# Patient Record
Sex: Female | Born: 2000
Health system: Southern US, Community
[De-identification: ages and names within clinical notes are randomized; demographics above are authoritative.]

## PROBLEM LIST (undated history)

## (undated) DIAGNOSIS — E119 Type 2 diabetes mellitus without complications: Secondary | ICD-10-CM

## (undated) DIAGNOSIS — J45909 Unspecified asthma, uncomplicated: Secondary | ICD-10-CM

## (undated) DIAGNOSIS — T7840XA Allergy, unspecified, initial encounter: Secondary | ICD-10-CM

## (undated) HISTORY — DX: Allergy, unspecified, initial encounter: T78.40XA

## (undated) HISTORY — PX: ADENOIDECTOMY: SUR15

## (undated) HISTORY — DX: Type 2 diabetes mellitus without complications: E11.9

---
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
## 2022-04-25 ENCOUNTER — Encounter (HOSPITAL_COMMUNITY): Payer: Self-pay

## 2022-04-25 ENCOUNTER — Ambulatory Visit (HOSPITAL_COMMUNITY)
Admission: EM | Admit: 2022-04-25 | Discharge: 2022-04-25 | Disposition: A | Discharge status: Home / Self Care | Payer: Medicaid Other | Attending: Emergency Medicine | Admitting: Emergency Medicine

## 2022-04-25 DIAGNOSIS — N939 Abnormal uterine and vaginal bleeding, unspecified: Secondary | ICD-10-CM | POA: Diagnosis present

## 2022-04-25 LAB — CBC
HCT: 39.3 % (ref 36.0–46.0)
Hemoglobin: 13.7 g/dL (ref 12.0–15.0)
MCH: 25.8 pg — ABNORMAL LOW (ref 26.0–34.0)
MCHC: 34.9 g/dL (ref 30.0–36.0)
MCV: 74.2 fL — ABNORMAL LOW (ref 80.0–100.0)
Platelets: 295 10*3/uL (ref 150–400)
RBC: 5.3 MIL/uL — ABNORMAL HIGH (ref 3.87–5.11)
RDW: 14.2 % (ref 11.5–15.5)
WBC: 7.5 10*3/uL (ref 4.0–10.5)
nRBC: 0 % (ref 0.0–0.2)

## 2022-04-25 LAB — HCG, QUANTITATIVE, PREGNANCY: hCG, Beta Chain, Quant, S: <1 m[IU]/mL (ref <5)

## 2022-04-25 LAB — POC URINE PREG, ED: Preg Test, Ur: NEGATIVE

## 2022-04-25 NOTE — ED Provider Notes (Signed)
[facility]    CSN: [id number] Arrival date & time: 04/25/22  1123     History   Chief Complaint Vaginal bleeding   HPI Debra [name] is a 21 y.o. female.  Presents with 2-day history of vaginal bleeding, multiple small clots.  LMP March 11.  Changes regular tampon 3-4 times a day.  She has abdominal cramping and nausea.  Reports usually periods are very regular.  Does not use any forms of birth control.  Took a pregnancy test in May that was negative.  Has not taken one since then.  She is sexually active and had unprotected intercourse recently.  Denies heavy bleeding or large clots, weakness, lightheadedness, dizziness, shortness of breath.  Past Medical History:  Diagnosis Date   Asthma    Diabetes mellitus without complication [facility])     Patient Active Problem List   Diagnosis Date Noted   Asthma 09/28/2017   Adjustment disorder of adolescence 05/07/2016   Abnormal weight gain 05/07/2016   Morbid obesity due to excess calories ([facility]) 12/02/2015   Type 2 diabetes mellitus without complication ([facility]) 05/29/2015   Acanthosis nigricans 05/29/2015   Morbid obesity ([facility]) 05/29/2015    Past Surgical History:  Procedure Laterality Date   ADENOIDECTOMY     at age 36 years    OB History   No obstetric history on file.      Home Medications    Prior to Admission medications   Not on File    Family History Family History  Problem Relation Age of Onset   Hypertension Mother    Hypertension Father    Asthma Brother    Cancer Maternal Grandfather    Hypertension Maternal Grandfather    Alcohol abuse Maternal Grandfather    Arthritis Maternal Grandfather    Drug abuse Maternal Grandfather    Mental illness Maternal Grandmother    Drug abuse Maternal Grandmother    Depression Maternal Grandmother    Alcohol abuse Paternal Grandmother    Arthritis Paternal Grandmother    Drug abuse Paternal Grandmother    Hearing loss Paternal Grandmother     Hypertension Paternal Grandmother     Social History Social History   Tobacco Use   Smoking status: Never   Smokeless tobacco: Never  Vaping Use   Vaping Use: Never used  Substance Use Topics   Alcohol use: Yes    Comment: occ   Drug use: Yes    Frequency: 7.0 times per week    Types: Marijuana     Allergies   Patient has no known allergies.   Review of Systems Review of Systems  Per HPI  Physical Exam Triage Vital Signs ED Triage Vitals  Enc Vitals Group     BP 04/25/22 1322 (!) 147/96     Pulse Rate 04/25/22 1322 98     Resp 04/25/22 1322 18     Temp 04/25/22 1322 98 F (36.7 C)     Temp Source 04/25/22 1322 Oral     SpO2 04/25/22 1322 98 %     Weight --      Height --      Head Circumference --      Peak Flow --      Pain Score 04/25/22 1318 4     Pain Loc --      Pain Edu? --      Excl. in GC? --    No data found.  Updated Vital Signs BP (!) 147/96 (BP Location: Right  Arm)   Pulse 98   Temp 98 F (36.7 C) (Oral)   Resp 18   LMP 01/22/2022   SpO2 98%    Physical Exam Vitals and nursing note reviewed.  Constitutional:      General: She is not in acute distress.    Appearance: Normal appearance. She is not ill-appearing.     Comments: Resting comfortably, no acute distress  HENT:     Mouth/Throat:     Mouth: Mucous membranes are moist.     Pharynx: Oropharynx is clear.  Eyes:     Conjunctiva/sclera: Conjunctivae normal.     Pupils: Pupils are equal, round, and reactive to light.  Cardiovascular:     Rate and Rhythm: Normal rate and regular rhythm.     Pulses: Normal pulses.     Heart sounds: Normal heart sounds.  Pulmonary:     Effort: Pulmonary effort is normal.     Breath sounds: Normal breath sounds.  Abdominal:     General: Bowel sounds are normal.     Tenderness: There is abdominal tenderness in the suprapubic area. There is no guarding or rebound.  Musculoskeletal:        General: Normal range of motion.     Cervical back:  Normal range of motion.  Lymphadenopathy:     Cervical: No cervical adenopathy.  Skin:    General: Skin is warm and dry.  Neurological:     General: No focal deficit present.     Mental Status: She is alert and oriented to person, place, and time.    UC Treatments / Results  Labs (all labs ordered are listed, but only abnormal results are displayed) Labs Reviewed  CBC - Abnormal; Notable for the following components:      Result Value   RBC 5.30 (*)    MCV 74.2 (*)    MCH 25.8 (*)    All other components within normal limits  HCG, QUANTITATIVE, PREGNANCY  POC URINE PREG, ED   EKG  Radiology No results found.  Procedures Procedures   Medications Ordered in UC Medications - No data to display  Initial Impression / Assessment and Plan / UC Course  I have reviewed the triage vital signs and the nursing notes.  Pertinent labs & imaging results that were available during my care of the patient were reviewed by me and considered in my medical decision making (see chart for details).  Attempted to get urine sample from patient, she used collection hat in the toilet and sample ended up being all vaginal blood.  She was unable to provide a clean-catch urine.  Ordered stat beta hCG and CBC.  beta hCG <1, hgb normal at 13.7.  We discussed etiology of her bleeding may be her menstrual cycle.  I recommend that she follow-up with an OB/GYN. Information for women's clinic provided. Return precautions discussed. Patient agrees to plan and is discharged in stable condition.  Final Clinical Impressions(s) / UC Diagnoses   Final diagnoses:  Vaginal bleeding     Discharge Instructions      I have attached information for one of the women's clinics in the area.  I recommend you establish with an OB/GYN and follow-up with them regarding your symptoms.    ED Prescriptions   None    PDMP not reviewed this encounter.   Keondra Haydu, Lurena Joiner, [place] 04/25/22 1603

## 2022-04-25 NOTE — Discharge Instructions (Addendum)
I have attached information for one of the women's clinics in the area.  I recommend you establish with an OB/GYN and follow-up with them regarding your symptoms.

## 2022-04-25 NOTE — ED Triage Notes (Signed)
Pt presents with vaginal bleeding and multiple pea sized clots.  States she hadn't had period since March 11th.  Took multiple negative pregnancy tests. Heavy bleeding started two days ago.  Reports cramping and slight queasiness this morning.

## 2022-11-09 ENCOUNTER — Encounter (HOSPITAL_COMMUNITY): Payer: Self-pay

## 2022-11-09 ENCOUNTER — Ambulatory Visit (HOSPITAL_COMMUNITY)
Admission: EM | Admit: 2022-11-09 | Discharge: 2022-11-09 | Disposition: A | Discharge status: Home / Self Care | Payer: Medicaid Other | Attending: Emergency Medicine | Admitting: Emergency Medicine

## 2022-11-09 DIAGNOSIS — J4521 Mild intermittent asthma with (acute) exacerbation: Secondary | ICD-10-CM | POA: Diagnosis not present

## 2022-11-09 MED ORDER — PREDNISONE 20 MG PO TABS: 40.0000 mg | ORAL_TABLET | Freq: Every day | ORAL | 0 refills | Status: DC

## 2022-11-09 MED ORDER — ALBUTEROL SULFATE HFA 108 (90 BASE) MCG/ACT IN AERS: 2.0000 | INHALATION_SPRAY | RESPIRATORY_TRACT | 0 refills | Status: DC | PRN

## 2022-11-09 MED ORDER — IPRATROPIUM-ALBUTEROL 0.5-2.5 (3) MG/3ML IN SOLN
RESPIRATORY_TRACT | Status: AC
  Filled 2022-11-09: qty 3

## 2022-11-09 MED ORDER — IPRATROPIUM-ALBUTEROL 0.5-2.5 (3) MG/3ML IN SOLN
3.0000 mL | Freq: Once | RESPIRATORY_TRACT | Status: AC
  Administered 2022-11-09: 3 mL via RESPIRATORY_TRACT

## 2022-11-09 NOTE — Discharge Instructions (Signed)
Today you are being treated for inflammation to your upper airways  In office you have been given a breathing treatment  Begin use of prednisone every morning with food for 5 days to help reduce inflammation   May use albuterol inhaler taking 2 puffs every 4 hours as needed to help calm shortness of breath and wheezing  In addition:  Maintaining adequate hydration may help to thin secretions and soothe the respiratory mucosa   Warm Liquids- Ingestion of warm liquids may have a soothing effect on the respiratory mucosa, increase the flow of nasal mucus, and loosen respiratory secretions, making them easier to remove  May try honey (2.5 to 5 mL [0.5 to 1 teaspoon]) can be given straight or diluted in liquid (juice). Corn syrup may be substituted if honey is not available.

## 2022-11-09 NOTE — ED Provider Notes (Signed)
[facility]    CSN: [id number] Arrival date & time: 11/09/22  1446      History   Chief Complaint Chief Complaint  Patient presents with   Shortness of Breath    HPI Debra [name] is a 20 y.o. female.   Patient presents for evaluation of cough, shortness of breath with rest worsened by exertion and intermittent wheezing for 3 days.  No appearance of wheezing today.  Associated chest soreness worsened by coughing.  Endorses nasal congestion at baseline with no change.  No known sick contacts.  Tolerating food and liquids.  History of asthma but lost albuterol inhaler.   Past Medical History:  Diagnosis Date   Asthma    Diabetes mellitus without complication [facility])     Patient Active Problem List   Diagnosis Date Noted   Asthma 09/28/2017   Adjustment disorder of adolescence 05/07/2016   Abnormal weight gain 05/07/2016   Morbid obesity due to excess calories (HCC) 12/02/2015   Type 2 diabetes mellitus without complication (HCC) 05/29/2015   Acanthosis nigricans 05/29/2015   Morbid obesity (HCC) 05/29/2015    Past Surgical History:  Procedure Laterality Date   ADENOIDECTOMY     at age 14 years    OB History   No obstetric history on file.      Home Medications    Prior to Admission medications   Not on File    Family History Family History  Problem Relation Age of Onset   Hypertension Mother    Hypertension Father    Asthma Brother    Cancer Maternal Grandfather    Hypertension Maternal Grandfather    Alcohol abuse Maternal Grandfather    Arthritis Maternal Grandfather    Drug abuse Maternal Grandfather    Mental illness Maternal Grandmother    Drug abuse Maternal Grandmother    Depression Maternal Grandmother    Alcohol abuse Paternal Grandmother    Arthritis Paternal Grandmother    Drug abuse Paternal Grandmother    Hearing loss Paternal Grandmother    Hypertension Paternal Grandmother     Social History Social History    Tobacco Use   Smoking status: Never   Smokeless tobacco: Never  Vaping Use   Vaping Use: Never used  Substance Use Topics   Alcohol use: Yes    Comment: occ   Drug use: Yes    Frequency: 7.0 times per week    Types: Marijuana     Allergies   Patient has no known allergies.   Review of Systems Review of Systems  Constitutional: Negative.   HENT: Negative.    Respiratory:  Positive for cough, shortness of breath and wheezing. Negative for apnea, choking, chest tightness and stridor.   Cardiovascular: Negative.   Gastrointestinal: Negative.   Skin: Negative.   Neurological: Negative.      Physical Exam Triage Vital Signs ED Triage Vitals [11/09/22 1735]  Enc Vitals Group     BP (!) 131/98     Pulse Rate 96     Resp 12     Temp 98.7 F (37.1 C)     Temp Source Oral     SpO2 97 %     Weight      Height      Head Circumference      Peak Flow      Pain Score      Pain Loc      Pain Edu?      Excl. in GC?  No data found.  Updated Vital Signs BP (!) 131/98 (BP Location: Left Arm)   Pulse 96   Temp 98.7 F (37.1 C) (Oral)   Resp 12   SpO2 97%   Visual Acuity Right Eye Distance:   Left Eye Distance:   Bilateral Distance:    Right Eye Near:   Left Eye Near:    Bilateral Near:     Physical Exam Constitutional:      Appearance: Normal appearance.  Eyes:     Extraocular Movements: Extraocular movements intact.  Cardiovascular:     Rate and Rhythm: Normal rate and regular rhythm.     Pulses: Normal pulses.     Heart sounds: Normal heart sounds.  Pulmonary:     Effort: Pulmonary effort is normal.     Breath sounds: Normal breath sounds.  Skin:    General: Skin is warm and dry.  Neurological:     Mental Status: She is alert and oriented to person, place, and time. Mental status is at baseline.      UC Treatments / Results  Labs (all labs ordered are listed, but only abnormal results are displayed) Labs Reviewed - No data to  display  EKG   Radiology No results found.  Procedures Procedures (including critical care time)  Medications Ordered in UC Medications - No data to display  Initial Impression / Assessment and Plan / UC Course  I have reviewed the triage vital signs and the nursing notes.  Pertinent labs & imaging results that were available during my care of the patient were reviewed by me and considered in my medical decision making (see chart for details).  Mild intermittent asthma with exacerbation  Vitals are stable, O2 saturation 97% on room air, lungs are clear to auscultation, discussed with patient, DuoNeb given per patient request, prescribed prednisone and refilled albuterol inhaler for outpatient use, declined cough medications at this time, recommended additional supportive care with urgent care follow-up as needed Final Clinical Impressions(s) / UC Diagnoses   Final diagnoses:  None   Discharge Instructions   None    ED Prescriptions   None    PDMP not reviewed this encounter.   Valinda Hoar, NP 11/09/22 1754

## 2022-11-09 NOTE — ED Triage Notes (Signed)
Pt is here for asthma flare x3days

## 2022-11-23 ENCOUNTER — Encounter: Payer: Self-pay | Admitting: Obstetrics & Gynecology

## 2022-11-23 ENCOUNTER — Encounter: Payer: Medicaid Other | Admitting: Radiology

## 2022-11-29 ENCOUNTER — Encounter: Payer: Medicaid Other | Admitting: Radiology

## 2022-12-02 ENCOUNTER — Encounter: Payer: Medicaid Other | Admitting: Obstetrics & Gynecology

## 2022-12-21 ENCOUNTER — Encounter: Payer: Self-pay | Admitting: Family Medicine

## 2022-12-21 ENCOUNTER — Ambulatory Visit (INDEPENDENT_AMBULATORY_CARE_PROVIDER_SITE_OTHER): Payer: Medicaid Other | Admitting: Family Medicine

## 2022-12-21 VITALS — BP 132/88 | HR 100 | Temp 97.6°F | Ht 66.0 in | Wt 319.0 lb

## 2022-12-21 DIAGNOSIS — Z9189 Other specified personal risk factors, not elsewhere classified: Secondary | ICD-10-CM | POA: Diagnosis not present

## 2022-12-21 DIAGNOSIS — E119 Type 2 diabetes mellitus without complications: Secondary | ICD-10-CM

## 2022-12-21 DIAGNOSIS — J4520 Mild intermittent asthma, uncomplicated: Secondary | ICD-10-CM

## 2022-12-21 LAB — CBC WITH DIFFERENTIAL/PLATELET
Basophils Absolute: 0 10*3/uL (ref 0.0–0.1)
Basophils Relative: 0.2 % (ref 0.0–3.0)
Eosinophils Absolute: 0.1 10*3/uL (ref 0.0–0.7)
Eosinophils Relative: 1 % (ref 0.0–5.0)
HCT: 36 % (ref 36.0–46.0)
Hemoglobin: 12.1 g/dL (ref 12.0–15.0)
Lymphocytes Relative: 26.1 % (ref 12.0–46.0)
Lymphs Abs: 2.5 10*3/uL (ref 0.7–4.0)
MCHC: 33.5 g/dL (ref 30.0–36.0)
MCV: 75.2 fl — ABNORMAL LOW (ref 78.0–100.0)
Monocytes Absolute: 0.5 10*3/uL (ref 0.1–1.0)
Monocytes Relative: 5.1 % (ref 3.0–12.0)
Neutro Abs: 6.4 10*3/uL (ref 1.4–7.7)
Neutrophils Relative %: 67.6 % (ref 43.0–77.0)
Platelets: 377 10*3/uL (ref 150.0–400.0)
RBC: 4.79 Mil/uL (ref 3.87–5.11)
RDW: 15.5 % (ref 11.5–15.5)
WBC: 9.4 10*3/uL (ref 4.0–10.5)

## 2022-12-21 LAB — HEMOGLOBIN A1C: Hgb A1c MFr Bld: 5.5 % (ref 4.6–6.5)

## 2022-12-21 LAB — COMPREHENSIVE METABOLIC PANEL
ALT: 15 U/L (ref 0–35)
AST: 14 U/L (ref 0–37)
Albumin: 4.2 g/dL (ref 3.5–5.2)
Alkaline Phosphatase: 59 U/L (ref 39–117)
BUN: 8 mg/dL (ref 6–23)
CO2: 27 mEq/L (ref 19–32)
Calcium: 9.1 mg/dL (ref 8.4–10.5)
Chloride: 106 mEq/L (ref 96–112)
Creatinine, Ser: 0.92 mg/dL (ref 0.40–1.20)
GFR: 88.56 mL/min (ref >60.00)
Glucose, Bld: 79 mg/dL (ref 70–99)
Potassium: 4.1 mEq/L (ref 3.5–5.1)
Sodium: 140 mEq/L (ref 135–145)
Total Bilirubin: 0.4 mg/dL (ref 0.2–1.2)
Total Protein: 7.2 g/dL (ref 6.0–8.3)

## 2022-12-21 LAB — TSH: TSH: 0.78 u[IU]/mL (ref 0.35–5.50)

## 2022-12-21 NOTE — Progress Notes (Signed)
New Patient Office Visit  Subjective    Patient ID: Debra [name], female    DOB: 2001-08-07  Age: 22 y.o. MRN: [id number]  CC:  Chief Complaint  Patient presents with   Establish Care    Would like to have blood work done as well as STD testing    HPI Debra [name] presents to establish care Moved here from [place], [place]   Having unprotected sex. No birth control. Requests STD testing.  No symptoms.   LMP: 12/08/2022  Hx of asthma. Uses albuterol prn  Diabetes- is not on medication.   Denies fever, chills, dizziness, chest pain, palpitations, shortness of breath, abdominal pain, N/V/D, urinary symptoms, LE edema.    Single. No kids.  Currently not employed.   Smokes marijuana. Alcohol on weekends.  No tobacco.    Outpatient Encounter Medications as of 12/21/2022  Medication Sig   albuterol (VENTOLIN HFA) 108 (90 Base) MCG/ACT inhaler Inhale 2 puffs into the lungs every 4 (four) hours as needed for wheezing or shortness of breath.   [DISCONTINUED] predniSONE (DELTASONE) 20 MG tablet Take 2 tablets (40 mg total) by mouth daily.   No facility-administered encounter medications on file as of 12/21/2022.    Past Medical History:  Diagnosis Date   Asthma    Diabetes mellitus without complication ([place])     Past Surgical History:  Procedure Laterality Date   ADENOIDECTOMY     at age 3 years    Family History  Problem Relation Age of Onset   Hypertension Mother    Hypertension Father    Asthma Brother    Cancer Maternal Grandfather    Hypertension Maternal Grandfather    Alcohol abuse Maternal Grandfather    Arthritis Maternal Grandfather    Drug abuse Maternal Grandfather    Mental illness Maternal Grandmother    Drug abuse Maternal Grandmother    Depression Maternal Grandmother    Alcohol abuse Paternal Grandmother    Arthritis Paternal Grandmother    Drug abuse Paternal Grandmother    Hearing loss Paternal Grandmother    Hypertension  Paternal Grandmother     Social History   Socioeconomic History   Marital status: Single    Spouse name: Not on file   Number of children: Not on file   Years of education: Not on file   Highest education level: Not on file  Occupational History   Occupation: Ship broker  Tobacco Use   Smoking status: Never   Smokeless tobacco: Never  Vaping Use   Vaping Use: Never used  Substance and Sexual Activity   Alcohol use: Yes    Comment: occ   Drug use: Yes    Frequency: 7.0 times per week    Types: Marijuana   Sexual activity: Yes    Birth control/protection: Condom    Comment: not consistent with condom use  Other Topics Concern   Not on file  Social History Narrative   9th grade [employer]      Mother, Step-father, and grandmother at home   Social Determinants of Health   Financial Resource Strain: Not on file  Food Insecurity: Not on file  Transportation Needs: Not on file  Physical Activity: Not on file  Stress: Not on file  Social Connections: Not on file  Intimate Partner Violence: Not on file    ROS      Objective    BP 132/88 (BP Location: Left Arm, Patient Position: Sitting, Cuff Size: Large)   Pulse 100  Temp 97.6 F (36.4 C) (Temporal)   Ht 5\' 6"  (1.676 m)   Wt (!) 319 lb (144.7 kg)   SpO2 98%   BMI 51.49 kg/m   Physical Exam Constitutional:      General: She is not in acute distress.    Appearance: She is obese. She is not ill-appearing.  Cardiovascular:     Rate and Rhythm: Normal rate.  Pulmonary:     Effort: Pulmonary effort is normal.  Neurological:     General: No focal deficit present.     Mental Status: She is alert and oriented to person, place, and time.  Psychiatric:        Mood and Affect: Mood normal.        Behavior: Behavior normal.         Assessment & Plan:   Problem List Items Addressed This Visit       Respiratory   Asthma - Primary     Endocrine   Type 2 diabetes mellitus without complication ([place])    Relevant Orders   CBC with Differential/Platelet (Completed)   Comprehensive metabolic panel (Completed)   Hemoglobin A1c (Completed)   TSH (Completed)     Other   Morbid obesity ([place])   Other Visit Diagnoses     At risk for sexually transmitted disease due to unprotected sex       Relevant Orders   Hepatitis C antibody   RPR   HIV Antibody (routine testing w rflx)   Hepatitis B surface antigen   GC/Chlamydia Probe Amp   Ambulatory referral to Obstetrics / Gynecology      She is new to the practice. Here requesting STD testing due to unprotected sex. Asymptomatic. She also requests referral to OB/GYN.  Asthma is not that bothersome. Use albuterol prn and follow up if needing it more than 2x per week.  Reviewed chart and labs showing hx of diabetes. I will check labs including A1c.  I will also look for other complications related to obesity. Follow up pending labs.   Return for pending labs.   Harland Dingwall, NP-C

## 2022-12-21 NOTE — Patient Instructions (Signed)
Please go downstairs for labs and a urine check before you leave.

## 2022-12-22 LAB — HEPATITIS C ANTIBODY: Hepatitis C Ab: NONREACTIVE

## 2022-12-22 LAB — HEPATITIS B SURFACE ANTIGEN: Hepatitis B Surface Ag: NONREACTIVE

## 2022-12-22 LAB — RPR: RPR Ser Ql: NONREACTIVE

## 2022-12-22 LAB — HIV ANTIBODY (ROUTINE TESTING W REFLEX): HIV 1&2 Ab, 4th Generation: NONREACTIVE

## 2022-12-23 ENCOUNTER — Encounter: Payer: Medicaid Other | Admitting: Obstetrics and Gynecology

## 2022-12-26 LAB — GC/CHLAMYDIA PROBE AMP
Chlamydia trachomatis, NAA: NEGATIVE
Neisseria Gonorrhoeae by PCR: POSITIVE — AB

## 2022-12-26 NOTE — Progress Notes (Signed)
She is positive for gonorrhea. She will need to come in to the office for treatment. She needs a ceftriaxone 500 mg IM injection. She should hold off on sexual activity for the next 7 days. She will need to let her sexual partner know about this and they should also get treated.

## 2023-04-01 ENCOUNTER — Other Ambulatory Visit: Payer: Self-pay

## 2023-04-01 ENCOUNTER — Encounter (HOSPITAL_COMMUNITY): Payer: Self-pay

## 2023-04-01 ENCOUNTER — Emergency Department (HOSPITAL_COMMUNITY)
Admission: EM | Admit: 2023-04-01 | Discharge: 2023-04-01 | Disposition: A | Discharge status: Home / Self Care | Payer: Medicaid Other | Attending: Emergency Medicine | Admitting: Emergency Medicine

## 2023-04-01 DIAGNOSIS — N939 Abnormal uterine and vaginal bleeding, unspecified: Secondary | ICD-10-CM | POA: Diagnosis present

## 2023-04-01 LAB — BASIC METABOLIC PANEL
Anion gap: 6 (ref 5–15)
BUN: 10 mg/dL (ref 6–20)
CO2: 23 mmol/L (ref 22–32)
Calcium: 8.3 mg/dL — ABNORMAL LOW (ref 8.9–10.3)
Chloride: 105 mmol/L (ref 98–111)
Creatinine, Ser: 0.78 mg/dL (ref 0.44–1.00)
GFR, Estimated: >60 mL/min (ref >60)
Glucose, Bld: 92 mg/dL (ref 70–99)
Potassium: 3.6 mmol/L (ref 3.5–5.1)
Sodium: 134 mmol/L — ABNORMAL LOW (ref 135–145)

## 2023-04-01 LAB — CBC WITH DIFFERENTIAL/PLATELET
Abs Immature Granulocytes: 0.01 10*3/uL (ref 0.00–0.07)
Basophils Absolute: 0 10*3/uL (ref 0.0–0.1)
Basophils Relative: 0 %
Eosinophils Absolute: 0.1 10*3/uL (ref 0.0–0.5)
Eosinophils Relative: 1 %
HCT: 32.6 % — ABNORMAL LOW (ref 36.0–46.0)
Hemoglobin: 11 g/dL — ABNORMAL LOW (ref 12.0–15.0)
Immature Granulocytes: 0 %
Lymphocytes Relative: 21 %
Lymphs Abs: 1.7 10*3/uL (ref 0.7–4.0)
MCH: 24.2 pg — ABNORMAL LOW (ref 26.0–34.0)
MCHC: 33.7 g/dL (ref 30.0–36.0)
MCV: 71.8 fL — ABNORMAL LOW (ref 80.0–100.0)
Monocytes Absolute: 0.5 10*3/uL (ref 0.1–1.0)
Monocytes Relative: 6 %
Neutro Abs: 5.9 10*3/uL (ref 1.7–7.7)
Neutrophils Relative %: 72 %
Platelets: 313 10*3/uL (ref 150–400)
RBC: 4.54 MIL/uL (ref 3.87–5.11)
RDW: 15.9 % — ABNORMAL HIGH (ref 11.5–15.5)
WBC: 8.2 10*3/uL (ref 4.0–10.5)
nRBC: 0 % (ref 0.0–0.2)

## 2023-04-01 LAB — I-STAT BETA HCG BLOOD, ED (MC, WL, AP ONLY): I-stat hCG, quantitative: <5 m[IU]/mL (ref <5)

## 2023-04-01 LAB — TYPE AND SCREEN
ABO/RH(D): O POS
Antibody Screen: NEGATIVE

## 2023-04-01 NOTE — Discharge Instructions (Addendum)
It was a pleasure caring for you today. Pregnancy test was negative. Blood work showed mild anemia. I recommend establishing care with gynecologist for annual exams and PAPs.  Seek emergency care if experiencing new or worsening symptoms such as severe abdominal pain or hemorrhagic bleeding.

## 2023-04-01 NOTE — ED Triage Notes (Signed)
Pt c/o intermittent abdominal cramping and vaginal bleeding/spotting x3 weeks.  Pt reports she is only seeing the blood when she wipes.  Denies n/v/d.  Pt is not on birth control.

## 2023-04-01 NOTE — ED Provider Notes (Signed)
[place] EMERGENCY DEPARTMENT AT [facility] Provider Note   CSN: [id number] Arrival date & time: 04/01/23  4098     History  Chief Complaint  Patient presents with   Vaginal Bleeding    Debra [name] is a 22 y.o. female complaining of intermittent vaginal bleeding over the past 3 weeks. Patient endorses LMP ending 4/27, but then had spotting May 3rd-6th, 12th, and 17th. Woke up this morning with cramping and spotting, and decided to come to ED. Menstrual periods regular every month. Endorses intercourse with 1 female partner over the past 12 months. Denies birthcontrol use and condom use. UPT at home at beginning of May was negative. No history of STD. No past PAPs - patient currently trying to obtain GYN care since she just moved back to [place].  Denies fever, chills, vaginal discharge, vaginal irritation, dyspareunia, chest pain, palpitations, SOB.     Vaginal Bleeding      Home Medications Prior to Admission medications   Medication Sig Start Date End Date Taking? Authorizing Provider  albuterol (VENTOLIN HFA) 108 (90 Base) MCG/ACT inhaler Inhale 2 puffs into the lungs every 4 (four) hours as needed for wheezing or shortness of breath. 11/09/22   Valinda Hoar, NP      Allergies    Patient has no known allergies.    Review of Systems   Review of Systems  Genitourinary:  Positive for vaginal bleeding.    Physical Exam Updated Vital Signs BP (!) 161/112 (BP Location: Right Arm)   Pulse (!) 101   Temp 98.3 F (36.8 C)   Resp 18   Ht 5\' 5"  (1.651 m)   Wt (!) 144.7 kg   LMP 03/09/2023 (Approximate)   SpO2 99%   BMI 53.08 kg/m  Physical Exam Vitals and nursing note reviewed.  Constitutional:      General: She is not in acute distress. HENT:     Head: Normocephalic and atraumatic.     Mouth/Throat:     Mouth: Mucous membranes are moist.     Pharynx: No oropharyngeal exudate or posterior oropharyngeal erythema.  Eyes:     General: No  scleral icterus.       Right eye: No discharge.        Left eye: No discharge.     Conjunctiva/sclera: Conjunctivae normal.  Cardiovascular:     Rate and Rhythm: Normal rate and regular rhythm.     Pulses: Normal pulses.     Heart sounds: Normal heart sounds. No murmur heard. Pulmonary:     Effort: Pulmonary effort is normal. No respiratory distress.     Breath sounds: Normal breath sounds. No wheezing, rhonchi or rales.  Abdominal:     General: Abdomen is flat. Bowel sounds are normal.     Palpations: Abdomen is soft. There is no mass.     Comments: Abdomen soft with mild pelvic tenderness to palpation - patient states that it feels like cramping.  Musculoskeletal:     Right lower leg: No edema.     Left lower leg: No edema.  Skin:    General: Skin is warm and dry.     Findings: No rash.  Neurological:     General: No focal deficit present.     Mental Status: She is alert. Mental status is at baseline.  Psychiatric:        Mood and Affect: Mood normal.     ED Results / Procedures / Treatments   Labs (all labs ordered  are listed, but only abnormal results are displayed) Labs Reviewed  CBC WITH DIFFERENTIAL/PLATELET - Abnormal; Notable for the following components:      Result Value   Hemoglobin 11.0 (*)    HCT 32.6 (*)    MCV 71.8 (*)    MCH 24.2 (*)    RDW 15.9 (*)    All other components within normal limits  BASIC METABOLIC PANEL - Abnormal; Notable for the following components:   Sodium 134 (*)    Calcium 8.3 (*)    All other components within normal limits  I-STAT BETA HCG BLOOD, ED ([facility], [facility], [facility] ONLY)  TYPE AND SCREEN    EKG None  Radiology No results found.  Procedures Procedures    Medications Ordered in ED Medications - No data to display  ED Course/ Medical Decision Making/ A&P                             Medical Decision Making  This patient presents to the ED for concern of vaginal bleeding, this involves an extensive number of treatment  options, and is a complaint that carries with it a high risk of complications and morbidity.  The differential diagnosis includes ectopic pregnancy, pregnancy termination, placental abruption, placenta previa, uterine rupture, postpartum hemorrhage, acute heavy menstrual bleeding, ruptured ovarian cyst, foreign body   Co morbidities that complicate the patient evaluation  none    Lab Tests:  I Ordered, and personally interpreted labs.  The pertinent results include:  - CBC: mild anemia; no concern for leukocytosis - BMP: no concern for electrolyte abnormality; no concern for kidney damage - Type & Screen: pending - I-stat beta hCG: negative    Problem List / ED Course / Critical interventions / Medication management  Patient presenting with intermittent vaginal bleeding/spotting. No reports of discharge when not bleeding, no pain with sex, vaginal irritation, abdominal pain, fevers - less concerning of STI/PID/Tubo-ovarian abscess. I-stat beta hCG negative - less concerning for ectopic/miscarriage. I am most concerned that patient has PCOS/irregular periods and educated patient that GYN can help manage symptoms long term. Offered patient pelvic exam - she declined stating that she would get this done at GYN appointment. Patient endorses that she is currently trying to find a gynecologist who takes medicare and that her PCP is helping her find provider. I educated patient on importance of having GYN and PAP every 3 years. Included GYN office info in discharge paperwork. Patient stating that she is ready to go home. Patient afebrile with stable vitals. Ready for discharge. Provided patient with return precautions. I have reviewed the patients home medicines and have made adjustments as needed     Social Determinants of Health:  none           Final Clinical Impression(s) / ED Diagnoses Final diagnoses:  Abnormal uterine bleeding (AUB)    Rx / DC Orders ED Discharge  Orders     None         Margarita Rana 04/01/23 1114    Bethann Berkshire, MD 04/01/23 1926

## 2023-04-03 ENCOUNTER — Telehealth: Payer: Self-pay

## 2023-04-03 NOTE — Transitions of Care (Post Inpatient/ED Visit) (Signed)
   04/03/2023  Name: Debra [name] MRN: [id number] DOB: [birth year]  Today's TOC FU Call Status: Today's TOC FU Call Status:: Successful TOC FU Call Competed TOC FU Call Complete Date: 04/03/23  Transition Care Management Follow-up Telephone Call Date of Discharge: 04/01/23 Discharge Facility: [facility]) Type of Discharge: Emergency Department Reason for ED Visit: Other: (abnormal uterine bleeding) How have you been since you were released from the hospital?: Same Any questions or concerns?: No  Items Reviewed: Did you receive and understand the discharge instructions provided?: Yes Medications obtained,verified, and reconciled?: Yes (Medications Reviewed) Any new allergies since your discharge?: No Dietary orders reviewed?: Yes Do you have support at home?: No  Medications Reviewed Today: Medications Reviewed Today     Reviewed by Karena Addison, LPN (Licensed Practical Nurse) on 04/03/23 at 1423  Med List Status: <None>   Medication Order Taking? Sig Documenting Provider Last Dose Status Informant  albuterol (VENTOLIN HFA) 108 (90 Base) MCG/ACT inhaler [id number] No Inhale 2 puffs into the lungs every 4 (four) hours as needed for wheezing or shortness of breath. Valinda Hoar, NP Taking Active             Home Care and Equipment/Supplies: Were Home Health Services Ordered?: NA Any new equipment or medical supplies ordered?: NA  Functional Questionnaire: Do you need assistance with bathing/showering or dressing?: No Do you need assistance with meal preparation?: No Do you need assistance with eating?: No Do you have difficulty maintaining continence: No Do you need assistance with getting out of bed/getting out of a chair/moving?: No Do you have difficulty managing or taking your medications?: No  Follow up appointments reviewed: PCP Follow-up appointment confirmed?: NA Specialist Hospital Follow-up appointment confirmed?: No Reason Specialist  Follow-Up Not Confirmed: Patient has Specialist Provider Number and will Call for Appointment Do you need transportation to your follow-up appointment?: No Do you understand care options if your condition(s) worsen?: Yes-patient verbalized understanding    SIGNATURE Karena Addison, LPN [facility] Nurse Health Advisor Direct Dial [phone]

## 2023-05-10 ENCOUNTER — Ambulatory Visit: Payer: Medicaid Other | Admitting: Family Medicine

## 2023-06-27 ENCOUNTER — Ambulatory Visit
Admission: EM | Admit: 2023-06-27 | Discharge: 2023-06-27 | Disposition: A | Discharge status: Home / Self Care | Payer: Medicaid Other | Attending: Physician Assistant | Admitting: Physician Assistant

## 2023-06-27 DIAGNOSIS — Z202 Contact with and (suspected) exposure to infections with a predominantly sexual mode of transmission: Secondary | ICD-10-CM | POA: Insufficient documentation

## 2023-06-27 MED ORDER — CEFTRIAXONE SODIUM 1 G IJ SOLR
1.0000 g | Freq: Once | INTRAMUSCULAR | Status: AC
  Administered 2023-06-27: 1 g via INTRAMUSCULAR

## 2023-06-27 NOTE — ED Provider Notes (Signed)
[name]    CSN: [id number] Arrival date & time: 06/27/23  1231      History   Chief Complaint Chief Complaint  Patient presents with   SEXUALLY TRANSMITTED DISEASE    Testing    HPI Debra [name] is a 22 y.o. female.   Patient here today for STD.  She states that her partner recently tested positive for gonorrhea.  She denies any current vaginal discharge or other symptoms.  She denies risk of pregnancy.  The history is provided by the patient.    Past Medical History:  Diagnosis Date   Asthma    Diabetes mellitus without complication [facility])     Patient Active Problem List   Diagnosis Date Noted   Asthma 09/28/2017   Adjustment disorder of adolescence 05/07/2016   Abnormal weight gain 05/07/2016   Morbid obesity due to excess calories (HCC) 12/02/2015   Type 2 diabetes mellitus without complication (HCC) 05/29/2015   Acanthosis nigricans 05/29/2015   Morbid obesity (HCC) 05/29/2015    Past Surgical History:  Procedure Laterality Date   ADENOIDECTOMY     at age 53 years    OB History   No obstetric history on file.      Home Medications    Prior to Admission medications   Medication Sig Start Date End Date Taking? Authorizing Provider  albuterol (VENTOLIN HFA) 108 (90 Base) MCG/ACT inhaler Inhale 2 puffs into the lungs every 4 (four) hours as needed for wheezing or shortness of breath. 11/09/22   Valinda Hoar, NP    Family History Family History  Problem Relation Age of Onset   Hypertension Mother    Hypertension Father    Asthma Brother    Cancer Maternal Grandfather    Hypertension Maternal Grandfather    Alcohol abuse Maternal Grandfather    Arthritis Maternal Grandfather    Drug abuse Maternal Grandfather    Mental illness Maternal Grandmother    Drug abuse Maternal Grandmother    Depression Maternal Grandmother    Alcohol abuse Paternal Grandmother    Arthritis Paternal Grandmother    Drug abuse Paternal  Grandmother    Hearing loss Paternal Grandmother    Hypertension Paternal Grandmother     Social History Social History   Tobacco Use   Smoking status: Never   Smokeless tobacco: Never  Vaping Use   Vaping status: Never Used  Substance Use Topics   Alcohol use: Yes    Comment: occassionally.   Drug use: Yes    Frequency: 7.0 times per week    Types: Marijuana     Allergies   Patient has no known allergies.   Review of Systems Review of Systems  Constitutional:  Negative for chills and fever.  Eyes:  Negative for discharge and redness.  Respiratory:  Negative for shortness of breath.   Gastrointestinal:  Negative for abdominal pain, nausea and vomiting.  Genitourinary:  Negative for genital sores and vaginal discharge.     Physical Exam Triage Vital Signs ED Triage Vitals  Encounter Vitals Group     BP 06/27/23 1357 124/82     Systolic BP Percentile --      Diastolic BP Percentile --      Pulse Rate 06/27/23 1357 81     Resp 06/27/23 1357 18     Temp 06/27/23 1357 98.1 F (36.7 C)     Temp Source 06/27/23 1357 Oral     SpO2 06/27/23 1357 99 %  Weight 06/27/23 1355 (!) 310 lb (140.6 kg)     Height 06/27/23 1355 5\' 5"  (1.651 m)     Head Circumference --      Peak Flow --      Pain Score 06/27/23 1355 0     Pain Loc --      Pain Education --      Exclude from Growth Chart --    No data found.  Updated Vital Signs BP 124/82 (BP Location: Left Arm)   Pulse 81   Temp 98.1 F (36.7 C) (Oral)   Resp 18   Ht 5\' 5"  (1.651 m)   Wt (!) 310 lb (140.6 kg)   LMP 06/02/2023 (Exact Date)   SpO2 99%   BMI 51.59 kg/m      Physical Exam Vitals and nursing note reviewed.  Constitutional:      General: She is not in acute distress.    Appearance: Normal appearance. She is not ill-appearing.  HENT:     Head: Normocephalic and atraumatic.  Eyes:     Conjunctiva/sclera: Conjunctivae normal.  Cardiovascular:     Rate and Rhythm: Normal rate.  Pulmonary:      Effort: Pulmonary effort is normal. No respiratory distress.  Neurological:     Mental Status: She is alert.  Psychiatric:        Mood and Affect: Mood normal.        Behavior: Behavior normal.        Thought Content: Thought content normal.      UC Treatments / Results  Labs (all labs ordered are listed, but only abnormal results are displayed) Labs Reviewed  CERVICOVAGINAL ANCILLARY ONLY    EKG   Radiology No results found.  Procedures Procedures (including critical care time)  Medications Ordered in UC Medications  cefTRIAXone (ROCEPHIN) injection 1 g (1 g Intramuscular Given 06/27/23 1410)    Initial Impression / Assessment and Plan / UC Course  I have reviewed the triage vital signs and the nursing notes.  Pertinent labs & imaging results that were available during my care of the patient were reviewed by me and considered in my medical decision making (see chart for details).  Clinical Course as of 06/27/23 1420  Tue Jun 27, 2023  1402 Cervicovaginal ancillary only [RM]    Clinical Course User Index [RM] Tomi Bamberger, PA-C   STD screening ordered.  Will treat to cover gonorrhea given known exposure.  Will await results further recommendation.  Encouraged follow-up with any further concerns or questions.  Final Clinical Impressions(s) / UC Diagnoses   Final diagnoses:  Exposure to STD   Discharge Instructions   None    ED Prescriptions   None    PDMP not reviewed this encounter.   Tomi Bamberger, PA-C 06/27/23 1424

## 2023-06-27 NOTE — ED Triage Notes (Signed)
"  My partner had recent STI testing and has Gonorrhea and I need testing". No vaginal discharge.

## 2023-06-28 ENCOUNTER — Telehealth (HOSPITAL_COMMUNITY): Payer: Self-pay | Admitting: Emergency Medicine

## 2023-06-28 MED ORDER — METRONIDAZOLE 500 MG PO TABS: 500.0000 mg | ORAL_TABLET | Freq: Two times a day (BID) | ORAL | 0 refills | Status: DC

## 2023-09-07 ENCOUNTER — Encounter: Payer: Self-pay | Admitting: Family Medicine

## 2023-09-07 ENCOUNTER — Ambulatory Visit (INDEPENDENT_AMBULATORY_CARE_PROVIDER_SITE_OTHER): Payer: Medicaid Other | Admitting: Family Medicine

## 2023-09-07 ENCOUNTER — Other Ambulatory Visit: Payer: Medicaid Other

## 2023-09-07 VITALS — BP 120/86 | HR 98 | Temp 97.7°F | Ht 65.0 in | Wt 324.0 lb

## 2023-09-07 DIAGNOSIS — Z7251 High risk heterosexual behavior: Secondary | ICD-10-CM | POA: Diagnosis not present

## 2023-09-07 DIAGNOSIS — R6883 Chills (without fever): Secondary | ICD-10-CM

## 2023-09-07 DIAGNOSIS — N920 Excessive and frequent menstruation with regular cycle: Secondary | ICD-10-CM

## 2023-09-07 DIAGNOSIS — J4520 Mild intermittent asthma, uncomplicated: Secondary | ICD-10-CM | POA: Diagnosis not present

## 2023-09-07 DIAGNOSIS — D649 Anemia, unspecified: Secondary | ICD-10-CM | POA: Diagnosis not present

## 2023-09-07 DIAGNOSIS — R52 Pain, unspecified: Secondary | ICD-10-CM

## 2023-09-07 DIAGNOSIS — Z124 Encounter for screening for malignant neoplasm of cervix: Secondary | ICD-10-CM

## 2023-09-07 DIAGNOSIS — Z6841 Body Mass Index (BMI) 40.0 and over, adult: Secondary | ICD-10-CM

## 2023-09-07 LAB — POC COVID19 BINAXNOW: SARS Coronavirus 2 Ag: NEGATIVE

## 2023-09-07 LAB — CBC WITH DIFFERENTIAL/PLATELET
Absolute Lymphocytes: 952 {cells}/uL (ref 850–3900)
Absolute Monocytes: 912 {cells}/uL (ref 200–950)
Basophils Absolute: 12 {cells}/uL (ref 0–200)
Basophils Relative: 0.3 %
Eosinophils Absolute: 32 {cells}/uL (ref 15–500)
Eosinophils Relative: 0.8 %
HCT: 39.8 % (ref 35.0–45.0)
Hemoglobin: 12.4 g/dL (ref 11.7–15.5)
MCH: 23.8 pg — ABNORMAL LOW (ref 27.0–33.0)
MCHC: 31.2 g/dL — ABNORMAL LOW (ref 32.0–36.0)
MCV: 76.2 fL — ABNORMAL LOW (ref 80.0–100.0)
MPV: 10.7 fL (ref 7.5–12.5)
Monocytes Relative: 22.8 %
Neutro Abs: 2092 {cells}/uL (ref 1500–7800)
Neutrophils Relative %: 52.3 %
Platelets: 275 10*3/uL (ref 140–400)
RBC: 5.22 10*6/uL — ABNORMAL HIGH (ref 3.80–5.10)
RDW: 17.6 % — ABNORMAL HIGH (ref 11.0–15.0)
Total Lymphocyte: 23.8 %
WBC: 4 10*3/uL (ref 3.8–10.8)

## 2023-09-07 LAB — COMPREHENSIVE METABOLIC PANEL
ALT: 18 U/L (ref 0–35)
AST: 23 U/L (ref 0–37)
Albumin: 3.8 g/dL (ref 3.5–5.2)
Alkaline Phosphatase: 52 U/L (ref 39–117)
BUN: 13 mg/dL (ref 6–23)
CO2: 27 meq/L (ref 19–32)
Calcium: 9.1 mg/dL (ref 8.4–10.5)
Chloride: 108 meq/L (ref 96–112)
Creatinine, Ser: 1.11 mg/dL (ref 0.40–1.20)
GFR: 70.35 mL/min (ref >60.00)
Glucose, Bld: 104 mg/dL — ABNORMAL HIGH (ref 70–99)
Potassium: 3.9 meq/L (ref 3.5–5.1)
Sodium: 140 meq/L (ref 135–145)
Total Bilirubin: 0.4 mg/dL (ref 0.2–1.2)
Total Protein: 6.3 g/dL (ref 6.0–8.3)

## 2023-09-07 LAB — TSH: TSH: 0.28 u[IU]/mL — ABNORMAL LOW (ref 0.35–5.50)

## 2023-09-07 LAB — T4, FREE: Free T4: 0.84 ng/dL (ref 0.60–1.60)

## 2023-09-07 LAB — FOLATE: Folate: 8.7 ng/mL (ref >5.9)

## 2023-09-07 LAB — POCT URINE PREGNANCY: Preg Test, Ur: NEGATIVE

## 2023-09-07 LAB — VITAMIN B12: Vitamin B-12: 259 pg/mL (ref 211–911)

## 2023-09-07 LAB — HEMOGLOBIN A1C: Hgb A1c MFr Bld: 5.4 % (ref 4.6–6.5)

## 2023-09-07 MED ORDER — ALBUTEROL SULFATE HFA 108 (90 BASE) MCG/ACT IN AERS
2.0000 | INHALATION_SPRAY | RESPIRATORY_TRACT | 0 refills | Status: DC | PRN
Start: 2023-09-07 — End: 2023-11-14

## 2023-09-07 NOTE — Patient Instructions (Signed)
Please go downstairs for labs.   I have referred you to an OB/GYN and if you have not heard from anyone in 2 weeks, let me know.   You may take over the counter iron once daily for anemia.   We will be in touch with your results.

## 2023-09-07 NOTE — Progress Notes (Signed)
Subjective:     Patient ID: Debra [name], female    DOB: 2001-02-21, 22 y.o.   MRN: [id number]  Chief Complaint  Patient presents with   Asthma    Wants to discuss replacement    Asthma There is no cough or shortness of breath. Associated symptoms include headaches, malaise/fatigue and myalgias. Pertinent negatives include no chest pain, fever or weight loss. Her past medical history is significant for asthma.     History of Present Illness         Here requesting refill of albuterol. Denies asthma symptoms today. No recent flare. Last one in December 2023  She has been out of her inhaler since last month.   She would like to have a pregnancy test. Sexually active.  LMP: 08/08/23   Headache, chills and body aches yesterday. Feels better today.   Heavy menses. Regular periods.  Anemia.  She has not seen OB/GYN     Health Maintenance Due  Topic Date Due   HPV VACCINES (1 - 3-dose series) Never done   DTaP/Tdap/Td (1 - Tdap) Never done   Cervical Cancer Screening (Pap smear)  Never done    Past Medical History:  Diagnosis Date   Asthma    Diabetes mellitus without complication (HCC)     Past Surgical History:  Procedure Laterality Date   ADENOIDECTOMY     at age 76 years    Family History  Problem Relation Age of Onset   Hypertension Mother    Hypertension Father    Asthma Brother    Cancer Maternal Grandfather    Hypertension Maternal Grandfather    Alcohol abuse Maternal Grandfather    Arthritis Maternal Grandfather    Drug abuse Maternal Grandfather    Mental illness Maternal Grandmother    Drug abuse Maternal Grandmother    Depression Maternal Grandmother    Alcohol abuse Paternal Grandmother    Arthritis Paternal Grandmother    Drug abuse Paternal Grandmother    Hearing loss Paternal Grandmother    Hypertension Paternal Grandmother     Social History   Socioeconomic History   Marital status: Single    Spouse name: Not on file    Number of children: Not on file   Years of education: Not on file   Highest education level: 12th grade  Occupational History   Occupation: Consulting civil engineer  Tobacco Use   Smoking status: Never   Smokeless tobacco: Never  Vaping Use   Vaping status: Never Used  Substance and Sexual Activity   Alcohol use: Yes    Comment: occassionally.   Drug use: Yes    Frequency: 7.0 times per week    Types: Marijuana   Sexual activity: Yes    Birth control/protection: Condom    Comment: not consistent with condom use  Other Topics Concern   Not on file  Social History Narrative   9th grade [employer]      Mother, Step-father, and grandmother at home   Social Determinants of Health   Financial Resource Strain: Medium Risk (05/09/2023)   Overall Financial Resource Strain (CARDIA)    Difficulty of Paying Living Expenses: Somewhat hard  Food Insecurity: Food Insecurity Present (05/09/2023)   Hunger Vital Sign    Worried About Running Out of Food in the Last Year: Sometimes true    Ran Out of Food in the Last Year: Sometimes true  Transportation Needs: No Transportation Needs (05/09/2023)   PRAPARE - Transportation    Lack of Transportation (  Medical): No    Lack of Transportation (Non-Medical): No  Physical Activity: Insufficiently Active (09/06/2023)   Exercise Vital Sign    Days of Exercise per Week: 4 days    Minutes of Exercise per Session: 30 min  Stress: No Stress Concern Present (09/06/2023)   [employer] of Occupational Health - Occupational Stress Questionnaire    Feeling of Stress : Not at all  Social Connections: Moderately Isolated (09/06/2023)   Social Connection and Isolation Panel [NHANES]    Frequency of Communication with Friends and Family: More than three times a week    Frequency of Social Gatherings with Friends and Family: More than three times a week    Attends Religious Services: Never    Database administrator or Organizations: No    Attends Hospital doctor: Not on file    Marital Status: Living with partner  Intimate Partner Violence: Not on file    Outpatient Medications Prior to Visit  Medication Sig Dispense Refill   albuterol (VENTOLIN HFA) 108 (90 Base) MCG/ACT inhaler Inhale 2 puffs into the lungs every 4 (four) hours as needed for wheezing or shortness of breath. 18 g 0   metroNIDAZOLE (FLAGYL) 500 MG tablet Take 1 tablet (500 mg total) by mouth 2 (two) times daily. 14 tablet 0   No facility-administered medications prior to visit.    No Known Allergies  Review of Systems  Constitutional:  Positive for chills and malaise/fatigue. Negative for fever and weight loss.  Respiratory:  Negative for cough and shortness of breath.   Cardiovascular:  Negative for chest pain, palpitations and leg swelling.  Gastrointestinal:  Negative for abdominal pain, constipation, diarrhea, nausea and vomiting.  Genitourinary:  Negative for dysuria, frequency and urgency.  Musculoskeletal:  Positive for myalgias.  Neurological:  Positive for headaches. Negative for dizziness and focal weakness.       Objective:    Physical Exam Constitutional:      General: She is not in acute distress.    Appearance: She is not ill-appearing.  Eyes:     Extraocular Movements: Extraocular movements intact.     Conjunctiva/sclera: Conjunctivae normal.  Cardiovascular:     Rate and Rhythm: Normal rate and regular rhythm.  Pulmonary:     Effort: Pulmonary effort is normal.     Breath sounds: Normal breath sounds.  Musculoskeletal:     Cervical back: Normal range of motion and neck supple.  Skin:    General: Skin is warm and dry.  Neurological:     General: No focal deficit present.     Mental Status: She is alert and oriented to person, place, and time.  Psychiatric:        Mood and Affect: Mood normal.        Behavior: Behavior normal.        Thought Content: Thought content normal.      BP 120/86 (BP Location: Left Arm, Patient Position:  Sitting, Cuff Size: Large)   Pulse 98   Temp 97.7 F (36.5 C) (Temporal)   Ht 5\' 5"  (1.651 m)   Wt (!) 324 lb (147 kg)   SpO2 100%   BMI 53.92 kg/m  Wt Readings from Last 3 Encounters:  09/07/23 (!) 324 lb (147 kg)  06/27/23 (!) 310 lb (140.6 kg)  04/01/23 (!) 319 lb (144.7 kg)       Assessment & Plan:   Problem List Items Addressed This Visit       Respiratory  Asthma - Primary   Relevant Medications   albuterol (VENTOLIN HFA) 108 (90 Base) MCG/ACT inhaler   Other Visit Diagnoses     Body aches       Relevant Orders   POC COVID-19 (Completed)   Chills       Relevant Orders   POC COVID-19 (Completed)   Unprotected sex       Relevant Orders   POCT urine pregnancy (Completed)   Anemia, unspecified type       Relevant Orders   CBC with Differential/Platelet   Comprehensive metabolic panel (Completed)   Iron, TIBC and Ferritin Panel (Completed)   Folate (Completed)   Vitamin B12 (Completed)   Ambulatory referral to Obstetrics / Gynecology   Menorrhagia with regular cycle       Relevant Orders   Comprehensive metabolic panel (Completed)   Ambulatory referral to Obstetrics / Gynecology   TSH (Completed)   T4, free (Completed)   Pap smear for cervical cancer screening       Relevant Orders   Ambulatory referral to Obstetrics / Gynecology   Severe obesity (BMI >= 40) (HCC)       Relevant Orders   Hemoglobin A1c (Completed)   TSH (Completed)   T4, free (Completed)      Covid test is negative. Improving. Treat symptoms.  Albuterol inhaler refilled. Asthma appears controlled.  Check labs to screen for complications related to obesity.  May start iron.  Referral to OB/GYN for her first pap smear and female health.   I have discontinued Vieno G. Pavlicek's metroNIDAZOLE. I am also having her maintain her albuterol.  Meds ordered this encounter  Medications   albuterol (VENTOLIN HFA) 108 (90 Base) MCG/ACT inhaler    Sig: Inhale 2 puffs into the lungs every  4 (four) hours as needed for wheezing or shortness of breath.    Dispense:  18 g    Refill:  0    Order Specific Question:   Supervising Provider    Answer:   Hillard Danker A [[id number]]

## 2023-09-08 LAB — IRON,TIBC AND FERRITIN PANEL
%SAT: 49 % — ABNORMAL HIGH (ref 16–45)
Ferritin: 88 ng/mL (ref 16–154)
Iron: 176 ug/dL (ref 40–190)
TIBC: 360 ug/dL (ref 250–450)

## 2023-09-08 NOTE — Progress Notes (Signed)
No anemia but her thyroid function is a little low. Please ask her to follow up with me in 2-3 weeks and we will recheck this. Also, her vitamin B12 is low normal. Please start on over the counter multi-vitamin if not already taking one.

## 2023-09-11 DIAGNOSIS — D649 Anemia, unspecified: Secondary | ICD-10-CM | POA: Insufficient documentation

## 2023-09-11 DIAGNOSIS — Z7251 High risk heterosexual behavior: Secondary | ICD-10-CM | POA: Insufficient documentation

## 2023-09-13 ENCOUNTER — Telehealth: Payer: Self-pay

## 2023-09-13 ENCOUNTER — Other Ambulatory Visit: Payer: Self-pay | Admitting: Family Medicine

## 2023-09-13 ENCOUNTER — Telehealth: Payer: Self-pay | Admitting: Family Medicine

## 2023-09-13 ENCOUNTER — Other Ambulatory Visit (HOSPITAL_COMMUNITY): Payer: Self-pay

## 2023-09-13 DIAGNOSIS — J4520 Mild intermittent asthma, uncomplicated: Secondary | ICD-10-CM

## 2023-09-13 NOTE — Telephone Encounter (Signed)
Pharmacy Patient Advocate Encounter   Received notification from Pt Calls Messages that prior authorization for Albuterol HFA inhaler is required/requested.   Insurance verification completed.   The patient is insured through  [facility] Medicaid  .   Per test claim:  Placed a call to [facility] to see if they would change to the brand name. Per the pharmacist, changed to the brand name and received a paid claim. PA not required.

## 2023-09-13 NOTE — Telephone Encounter (Signed)
Patient needs a prior authorization started for albuterol (VENTOLIN HFA) 108 (90 Base) MCG/ACT inhaler.

## 2023-09-13 NOTE — Telephone Encounter (Signed)
Copy of patients insurance card is needed.

## 2023-11-14 ENCOUNTER — Ambulatory Visit
Admission: EM | Admit: 2023-11-14 | Discharge: 2023-11-14 | Disposition: A | Discharge status: Home / Self Care | Payer: Medicaid Other | Attending: Family Medicine | Admitting: Family Medicine

## 2023-11-14 DIAGNOSIS — J4520 Mild intermittent asthma, uncomplicated: Secondary | ICD-10-CM | POA: Diagnosis not present

## 2023-11-14 DIAGNOSIS — J4530 Mild persistent asthma, uncomplicated: Secondary | ICD-10-CM | POA: Diagnosis not present

## 2023-11-14 DIAGNOSIS — J4531 Mild persistent asthma with (acute) exacerbation: Secondary | ICD-10-CM | POA: Diagnosis not present

## 2023-11-14 MED ORDER — PREDNISONE 20 MG PO TABS: ORAL_TABLET | ORAL | 0 refills | Status: DC

## 2023-11-14 MED ORDER — ALBUTEROL SULFATE HFA 108 (90 BASE) MCG/ACT IN AERS
2.0000 | INHALATION_SPRAY | RESPIRATORY_TRACT | 0 refills | Status: DC | PRN
Start: 2023-11-14 — End: 2024-05-09

## 2023-11-14 MED ORDER — IPRATROPIUM-ALBUTEROL 0.5-2.5 (3) MG/3ML IN SOLN
3.0000 mL | Freq: Once | RESPIRATORY_TRACT | Status: AC
  Administered 2023-11-14: 3 mL via RESPIRATORY_TRACT

## 2023-11-14 MED ORDER — PROMETHAZINE-DM 6.25-15 MG/5ML PO SYRP: 5.0000 mL | ORAL_SOLUTION | Freq: Three times a day (TID) | ORAL | 0 refills | Status: DC | PRN

## 2023-11-14 NOTE — ED Triage Notes (Signed)
 Pt c/o wheezing,chest tightness, dry cough x 5 days-denies fever-feels is asthma related-using inhaler with temporary relief-NAD-steady gait

## 2023-11-14 NOTE — ED Provider Notes (Signed)
 [facility]  Note:  This document was prepared using Conservation officer, historic buildings and may include unintentional dictation errors.  MRN: [id number] DOB: 20-Jun-2001  Subjective:   Debra [name] is a 22 y.o. female presenting for 5-day history of persistent wheezing, chest tightness, dry cough.  Typically her asthma flares up around this time of year.  Does better with nebulizer treatments in clinic.  Would like a refill of albuterol .  Patient does smoke marijuana.  No current facility-administered medications for this encounter.  Current Outpatient Medications:    albuterol  (VENTOLIN  HFA) 108 (90 Base) MCG/ACT inhaler, Inhale 2 puffs into the lungs every 4 (four) hours as needed for wheezing or shortness of breath., Disp: 18 g, Rfl: 0   No Known Allergies  Past Medical History:  Diagnosis Date   Asthma    Diabetes mellitus without complication (HCC)      Past Surgical History:  Procedure Laterality Date   ADENOIDECTOMY     at age 59 years    Family History  Problem Relation Age of Onset   Hypertension Mother    Hypertension Father    Asthma Brother    Cancer Maternal Grandfather    Hypertension Maternal Grandfather    Alcohol abuse Maternal Grandfather    Arthritis Maternal Grandfather    Drug abuse Maternal Grandfather    Mental illness Maternal Grandmother    Drug abuse Maternal Grandmother    Depression Maternal Grandmother    Alcohol abuse Paternal Grandmother    Arthritis Paternal Grandmother    Drug abuse Paternal Grandmother    Hearing loss Paternal Grandmother    Hypertension Paternal Grandmother     Social History   Tobacco Use   Smoking status: Never   Smokeless tobacco: Never  Vaping Use   Vaping status: Never Used  Substance Use Topics   Alcohol use: Yes    Comment: occ   Drug use: Yes    Types: Marijuana    ROS   Objective:   Vitals: BP (!) 133/96 (BP Location: Right Arm)   Pulse 85   Temp 98.7 F  (37.1 C) (Oral)   Resp 20   LMP 11/03/2023   SpO2 97%   Physical Exam Constitutional:      General: She is not in acute distress.    Appearance: Normal appearance. She is well-developed. She is obese. She is not ill-appearing, toxic-appearing or diaphoretic.  HENT:     Head: Normocephalic and atraumatic.     Nose: Nose normal.     Mouth/Throat:     Mouth: Mucous membranes are moist.  Eyes:     General: No scleral icterus.       Right eye: No discharge.        Left eye: No discharge.     Extraocular Movements: Extraocular movements intact.  Cardiovascular:     Rate and Rhythm: Normal rate and regular rhythm.     Heart sounds: Normal heart sounds. No murmur heard.    No friction rub. No gallop.  Pulmonary:     Effort: Pulmonary effort is normal. No respiratory distress.     Breath sounds: No stridor. Examination of the right-middle field reveals wheezing. Examination of the left-middle field reveals wheezing. Wheezing present. No rhonchi or rales.  Chest:     Chest wall: No tenderness.  Skin:    General: Skin is warm and dry.  Neurological:     General: No focal deficit present.     Mental  Status: She is alert and oriented to person, place, and time.  Psychiatric:        Mood and Affect: Mood normal.        Behavior: Behavior normal.    A 0.5 mg - 2.5 mg ipratropium albuterol  nebulizer treatment was administered.   Assessment and Plan :   PDMP not reviewed this encounter.  1. Mild intermittent asthma without complication   2. Mild persistent asthma with acute exacerbation    Nebulizer treatment as above.  Start an oral prednisone  course.  Refilled her albuterol .  Will defer x-ray as I have low suspicion for an infectious process.  Counseled patient on potential for adverse effects with medications prescribed/recommended today, ER and return-to-clinic precautions discussed, patient verbalized understanding.    Christopher Savannah, PA-C 11/14/23 [id number]

## 2024-02-15 ENCOUNTER — Ambulatory Visit
Admission: EM | Admit: 2024-02-15 | Discharge: 2024-02-15 | Disposition: A | Discharge status: Home / Self Care | Payer: Self-pay | Attending: Physician Assistant | Admitting: Physician Assistant

## 2024-02-15 DIAGNOSIS — H60393 Other infective otitis externa, bilateral: Secondary | ICD-10-CM

## 2024-02-15 MED ORDER — CIPROFLOXACIN-DEXAMETHASONE 0.3-0.1 % OT SUSP: 4.0000 [drp] | Freq: Two times a day (BID) | OTIC | 0 refills | Status: AC

## 2024-02-15 NOTE — ED Triage Notes (Signed)
"  My ears are hurting, they have been hurting for a while, I was ignoring it but the pain is worse, I think I may have an infection in both". No fever. No injury.

## 2024-02-18 NOTE — ED Provider Notes (Signed)
 [name]    CSN: [id number] Arrival date & time: 02/15/24  1119      History   Chief Complaint Chief Complaint  Patient presents with   Otalgia    HPI Debra [name] is a 23 y.o. female.   Patient presents today for bilateral ear discomfort.  She reports that symptoms have been going on for "a while" but she has been ignoring it.  She states that pain seems to be worsening.  She has not had any fever.  She denies any injury.  The history is provided by the patient.  Otalgia Associated symptoms: no congestion, no cough, no diarrhea, no fever and no vomiting     Past Medical History:  Diagnosis Date   Asthma    Diabetes mellitus without complication [facility])     Patient Active Problem List   Diagnosis Date Noted   Unprotected sex 09/11/2023   Anemia 09/11/2023   Asthma 09/28/2017   Severe obesity (BMI >= 40) (HCC) 05/29/2015    Past Surgical History:  Procedure Laterality Date   ADENOIDECTOMY     at age 82 years    OB History   No obstetric history on file.      Home Medications    Prior to Admission medications   Medication Sig Start Date End Date Taking? Authorizing Provider  ciprofloxacin-dexamethasone (CIPRODEX) OTIC suspension Place 4 drops into both ears 2 (two) times daily for 7 days. 02/15/24 02/22/24 Yes Tomi Bamberger, PA-C  albuterol (VENTOLIN HFA) 108 (90 Base) MCG/ACT inhaler Inhale 2 puffs into the lungs every 4 (four) hours as needed for wheezing or shortness of breath. 11/14/23   Wallis Bamberg, PA-C  predniSONE (DELTASONE) 20 MG tablet Take 2 tablets daily with breakfast. 11/14/23   Wallis Bamberg, PA-C  promethazine-dextromethorphan (PROMETHAZINE-DM) 6.25-15 MG/5ML syrup Take 5 mLs by mouth 3 (three) times daily as needed for cough. 11/14/23   Wallis Bamberg, PA-C    Family History Family History  Problem Relation Age of Onset   Hypertension Mother    Hypertension Father    Asthma Brother    Cancer Maternal Grandfather     Hypertension Maternal Grandfather    Alcohol abuse Maternal Grandfather    Arthritis Maternal Grandfather    Drug abuse Maternal Grandfather    Mental illness Maternal Grandmother    Drug abuse Maternal Grandmother    Depression Maternal Grandmother    Alcohol abuse Paternal Grandmother    Arthritis Paternal Grandmother    Drug abuse Paternal Grandmother    Hearing loss Paternal Grandmother    Hypertension Paternal Grandmother     Social History Social History   Tobacco Use   Smoking status: Never   Smokeless tobacco: Never  Vaping Use   Vaping status: Never Used  Substance Use Topics   Alcohol use: Yes    Comment: Occassionally.   Drug use: Yes    Types: Marijuana     Allergies   Patient has no known allergies.   Review of Systems Review of Systems  Constitutional:  Negative for chills and fever.  HENT:  Positive for ear pain. Negative for congestion.   Eyes:  Negative for discharge and redness.  Respiratory:  Negative for cough and shortness of breath.   Gastrointestinal:  Negative for diarrhea and vomiting.     Physical Exam Triage Vital Signs ED Triage Vitals  Encounter Vitals Group     BP 02/15/24 1129 134/83     Systolic BP Percentile --  Diastolic BP Percentile --      Pulse Rate 02/15/24 1129 88     Resp 02/15/24 1129 18     Temp 02/15/24 1129 98 F (36.7 C)     Temp Source 02/15/24 1129 Oral     SpO2 02/15/24 1129 96 %     Weight 02/15/24 1126 (!) 320 lb (145.2 kg)     Height 02/15/24 1126 5\' 5"  (1.651 m)     Head Circumference --      Peak Flow --      Pain Score 02/15/24 1123 7     Pain Loc --      Pain Education --      Exclude from Growth Chart --    No data found.  Updated Vital Signs BP 134/83 (BP Location: Left Arm)   Pulse 88   Temp 98 F (36.7 C) (Oral)   Resp 18   Ht 5\' 5"  (1.651 m)   Wt (!) 320 lb (145.2 kg)   LMP 02/08/2024 (Exact Date)   SpO2 96%   BMI 53.25 kg/m   Visual Acuity Right Eye Distance:   Left Eye  Distance:   Bilateral Distance:    Right Eye Near:   Left Eye Near:    Bilateral Near:     Physical Exam Vitals and nursing note reviewed.  Constitutional:      General: She is not in acute distress.    Appearance: Normal appearance. She is not ill-appearing.  HENT:     Head: Normocephalic and atraumatic.     Right Ear: Tympanic membrane normal.     Left Ear: Tympanic membrane normal.     Ears:     Comments: Bilateral external ear canal with erythema, exudate noted to right with tenderness with insertion of otoscope speculum.    Nose: Congestion present.     Mouth/Throat:     Mouth: Mucous membranes are moist.     Pharynx: No oropharyngeal exudate or posterior oropharyngeal erythema.  Eyes:     Conjunctiva/sclera: Conjunctivae normal.  Cardiovascular:     Rate and Rhythm: Normal rate and regular rhythm.     Heart sounds: Normal heart sounds. No murmur heard. Pulmonary:     Effort: Pulmonary effort is normal. No respiratory distress.     Breath sounds: Normal breath sounds. No wheezing, rhonchi or rales.  Skin:    General: Skin is warm and dry.  Neurological:     Mental Status: She is alert.  Psychiatric:        Mood and Affect: Mood normal.        Thought Content: Thought content normal.      UC Treatments / Results  Labs (all labs ordered are listed, but only abnormal results are displayed) Labs Reviewed - No data to display  EKG   Radiology No results found.  Procedures Procedures (including critical care time)  Medications Ordered in UC Medications - No data to display  Initial Impression / Assessment and Plan / UC Course  I have reviewed the triage vital signs and the nursing notes.  Pertinent labs & imaging results that were available during my care of the patient were reviewed by me and considered in my medical decision making (see chart for details).    Will treat to cover otitis externa with ciprodex. Encouraged follow up if no gradual  improvement or with any further concerns.   Final Clinical Impressions(s) / UC Diagnoses   Final diagnoses:  Infective otitis externa of both  ears   Discharge Instructions   None    ED Prescriptions     Medication Sig Dispense Auth. Provider   ciprofloxacin-dexamethasone (CIPRODEX) OTIC suspension Place 4 drops into both ears 2 (two) times daily for 7 days. 7.5 mL Tomi Bamberger, PA-C      PDMP not reviewed this encounter.   Tomi Bamberger, PA-C 02/18/24 [phone]

## 2024-05-09 ENCOUNTER — Ambulatory Visit (INDEPENDENT_AMBULATORY_CARE_PROVIDER_SITE_OTHER): Admitting: Family Medicine

## 2024-05-09 ENCOUNTER — Encounter: Payer: Self-pay | Admitting: Family Medicine

## 2024-05-09 VITALS — BP 126/86 | HR 89 | Temp 97.8°F | Ht 65.0 in | Wt 340.0 lb

## 2024-05-09 DIAGNOSIS — R7989 Other specified abnormal findings of blood chemistry: Secondary | ICD-10-CM

## 2024-05-09 DIAGNOSIS — Z0001 Encounter for general adult medical examination with abnormal findings: Secondary | ICD-10-CM

## 2024-05-09 DIAGNOSIS — R7303 Prediabetes: Secondary | ICD-10-CM

## 2024-05-09 DIAGNOSIS — J4520 Mild intermittent asthma, uncomplicated: Secondary | ICD-10-CM | POA: Diagnosis not present

## 2024-05-09 DIAGNOSIS — Z6841 Body Mass Index (BMI) 40.0 and over, adult: Secondary | ICD-10-CM | POA: Diagnosis not present

## 2024-05-09 DIAGNOSIS — Z9189 Other specified personal risk factors, not elsewhere classified: Secondary | ICD-10-CM

## 2024-05-09 LAB — COMPREHENSIVE METABOLIC PANEL WITH GFR
ALT: 13 U/L (ref 0–35)
AST: 14 U/L (ref 0–37)
Albumin: 4 g/dL (ref 3.5–5.2)
Alkaline Phosphatase: 60 U/L (ref 39–117)
BUN: 7 mg/dL (ref 6–23)
CO2: 27 meq/L (ref 19–32)
Calcium: 9.1 mg/dL (ref 8.4–10.5)
Chloride: 106 meq/L (ref 96–112)
Creatinine, Ser: 0.94 mg/dL (ref 0.40–1.20)
GFR: 85.47 mL/min (ref >60.00)
Glucose, Bld: 85 mg/dL (ref 70–99)
Potassium: 3.8 meq/L (ref 3.5–5.1)
Sodium: 138 meq/L (ref 135–145)
Total Bilirubin: 0.5 mg/dL (ref 0.2–1.2)
Total Protein: 7 g/dL (ref 6.0–8.3)

## 2024-05-09 LAB — CBC WITH DIFFERENTIAL/PLATELET
Basophils Absolute: 0 10*3/uL (ref 0.0–0.1)
Basophils Relative: 0.1 % (ref 0.0–3.0)
Eosinophils Absolute: 0.2 10*3/uL (ref 0.0–0.7)
Eosinophils Relative: 2.4 % (ref 0.0–5.0)
HCT: 39.2 % (ref 36.0–46.0)
Hemoglobin: 13.3 g/dL (ref 12.0–15.0)
Lymphocytes Relative: 24.5 % (ref 12.0–46.0)
Lymphs Abs: 2.2 10*3/uL (ref 0.7–4.0)
MCHC: 33.9 g/dL (ref 30.0–36.0)
MCV: 75 fl — ABNORMAL LOW (ref 78.0–100.0)
Monocytes Absolute: 0.8 10*3/uL (ref 0.1–1.0)
Monocytes Relative: 8.4 % (ref 3.0–12.0)
Neutro Abs: 5.9 10*3/uL (ref 1.4–7.7)
Neutrophils Relative %: 64.6 % (ref 43.0–77.0)
Platelets: 337 10*3/uL (ref 150.0–400.0)
RBC: 5.22 Mil/uL — ABNORMAL HIGH (ref 3.87–5.11)
RDW: 15.7 % — ABNORMAL HIGH (ref 11.5–15.5)
WBC: 9.1 10*3/uL (ref 4.0–10.5)

## 2024-05-09 LAB — T4, FREE: Free T4: 0.94 ng/dL (ref 0.60–1.60)

## 2024-05-09 LAB — LIPID PANEL
Cholesterol: 123 mg/dL (ref 0–200)
HDL: 28.1 mg/dL — ABNORMAL LOW (ref >39.00)
LDL Cholesterol: 73 mg/dL (ref 0–99)
NonHDL: 95.07
Total CHOL/HDL Ratio: 4
Triglycerides: 108 mg/dL (ref 0.0–149.0)
VLDL: 21.6 mg/dL (ref 0.0–40.0)

## 2024-05-09 LAB — HEMOGLOBIN A1C: Hgb A1c MFr Bld: 5.7 % (ref 4.6–6.5)

## 2024-05-09 LAB — TSH: TSH: 0.58 u[IU]/mL (ref 0.35–5.50)

## 2024-05-09 MED ORDER — ALBUTEROL SULFATE HFA 108 (90 BASE) MCG/ACT IN AERS
2.0000 | INHALATION_SPRAY | RESPIRATORY_TRACT | 1 refills | Status: DC | PRN
Start: 2024-05-09 — End: 2024-07-16

## 2024-05-09 NOTE — Patient Instructions (Signed)

## 2024-05-09 NOTE — Progress Notes (Addendum)
 Complete physical exam  Patient: Debra [name]   DOB: [birth year]   23 y.o. Female  MRN: [id number]  Subjective:    Chief Complaint  Patient presents with   Annual Exam    Wants blood work done.    She is here for a complete physical exam. She has an appt with OB/GYN on August 2nd for her first pap smear   She is attempting pregnancy. Would like STD testing.   She smokes marijuana daily   LMP: started today   Is not working or going to school  No kids    Health Maintenance  Topic Date Due   HPV Vaccine (1 - 3-dose series) Never done   Meningitis B Vaccine (1 of 2 - Standard) Never done   Pneumococcal Vaccination (1 of 2 - PCV) Never done   Hepatitis B Vaccine (1 of 3 - 19+ 3-dose series) Never done   Pap Smear  Never done   DTaP/Tdap/Td vaccine (2 - Td or Tdap) 06/18/2022   COVID-19 Vaccine (1 - 2024-25 season) 05/25/2024*   Flu Shot  06/14/2024   Chlamydia screening  06/26/2024   Hepatitis C Screening  Completed   HIV Screening  Completed  *Topic was postponed. The date shown is not the original due date.    Wears seatbelt always, smoke detectors in home and functioning, does not text while driving, feels safe in home environment.  Depression screening:    05/09/2024    2:37 PM 09/07/2023    2:42 PM 03/21/2017   10:15 AM  Depression screen PHQ 2/9  Decreased Interest 0 0 0  Down, Depressed, Hopeless 0 0 0  PHQ - 2 Score 0 0 0   Anxiety Screening:     No data to display          Vision:Within last year and Dental: No current dental problems and No regular dental care   Patient Active Problem List   Diagnosis Date Noted   Unprotected sex 09/11/2023   Anemia 09/11/2023   Asthma 09/28/2017   Severe obesity (BMI >= 40) (HCC) 05/29/2015   Past Medical History:  Diagnosis Date   Allergy    Asthma    Diabetes mellitus without complication (HCC)    Past Surgical History:  Procedure Laterality Date   ADENOIDECTOMY     at age 46 years   Social  History   Tobacco Use   Smoking status: Never   Smokeless tobacco: Never  Vaping Use   Vaping status: Never Used  Substance Use Topics   Alcohol use: Yes    Comment: Occassionally.   Drug use: Yes    Types: Marijuana      Patient Care Team: Lendia Boby CROME, NP-C as PCP - General (Family Medicine) Willo Rosina Kurk, MD (Inactive) as Consulting Physician (Pediatrics)   Outpatient Medications Prior to Visit  Medication Sig   [DISCONTINUED] albuterol  (VENTOLIN  HFA) 108 (90 Base) MCG/ACT inhaler Inhale 2 puffs into the lungs every 4 (four) hours as needed for wheezing or shortness of breath.   [DISCONTINUED] predniSONE  (DELTASONE ) 20 MG tablet Take 2 tablets daily with breakfast.   [DISCONTINUED] promethazine -dextromethorphan (PROMETHAZINE -DM) 6.25-15 MG/5ML syrup Take 5 mLs by mouth 3 (three) times daily as needed for cough.   No facility-administered medications prior to visit.    Review of Systems  Constitutional:  Negative for chills, fever, malaise/fatigue and weight loss.  HENT:  Negative for congestion, ear pain, sinus pain and sore throat.   Eyes:  Negative  for blurred vision, double vision and pain.  Respiratory:  Negative for cough, shortness of breath and wheezing.   Cardiovascular:  Negative for chest pain, palpitations and leg swelling.  Gastrointestinal:  Negative for abdominal pain, constipation, diarrhea, nausea and vomiting.  Genitourinary:  Negative for dysuria, frequency and urgency.  Musculoskeletal:  Negative for back pain, joint pain and myalgias.  Skin:  Negative for rash.  Neurological:  Negative for dizziness, tingling, focal weakness and headaches.  Psychiatric/Behavioral:  Negative for depression and suicidal ideas. The patient is not nervous/anxious.        Objective:    BP 126/86 (BP Location: Left Arm, Patient Position: Sitting)   Pulse 89   Temp 97.8 F (36.6 C) (Temporal)   Ht 5' 5 (1.651 m)   Wt (!) 340 lb (154.2 kg)   SpO2 98%    BMI 56.58 kg/m  BP Readings from Last 3 Encounters:  05/09/24 126/86  02/15/24 134/83  11/14/23 (!) 133/96   Wt Readings from Last 3 Encounters:  05/09/24 (!) 340 lb (154.2 kg)  02/15/24 (!) 320 lb (145.2 kg)  09/07/23 (!) 324 lb (147 kg)    Physical Exam Constitutional:      General: She is not in acute distress.    Appearance: She is obese. She is not ill-appearing.  HENT:     Right Ear: Tympanic membrane, ear canal and external ear normal.     Left Ear: Tympanic membrane, ear canal and external ear normal.     Nose: Nose normal.     Mouth/Throat:     Mouth: Mucous membranes are moist.     Pharynx: Oropharynx is clear.   Eyes:     Extraocular Movements: Extraocular movements intact.     Conjunctiva/sclera: Conjunctivae normal.     Pupils: Pupils are equal, round, and reactive to light.   Neck:     Thyroid : No thyroid  mass, thyromegaly or thyroid  tenderness.   Cardiovascular:     Rate and Rhythm: Normal rate and regular rhythm.     Pulses: Normal pulses.     Heart sounds: Normal heart sounds.  Pulmonary:     Effort: Pulmonary effort is normal.     Breath sounds: Normal breath sounds.  Abdominal:     General: Bowel sounds are normal.     Palpations: Abdomen is soft.     Tenderness: There is no abdominal tenderness. There is no right CVA tenderness, left CVA tenderness, guarding or rebound.   Musculoskeletal:        General: Normal range of motion.     Cervical back: Normal range of motion and neck supple. No tenderness.     Right lower leg: No edema.     Left lower leg: No edema.  Lymphadenopathy:     Cervical: No cervical adenopathy.   Skin:    General: Skin is warm and dry.     Findings: No lesion or rash.   Neurological:     General: No focal deficit present.     Mental Status: She is alert and oriented to person, place, and time.     Cranial Nerves: No cranial nerve deficit.     Sensory: No sensory deficit.     Motor: No weakness.     Gait: Gait  normal.   Psychiatric:        Mood and Affect: Mood normal.        Behavior: Behavior normal.        Thought Content: Thought content normal.  Results for orders placed or performed in visit on 05/09/24  Hepatitis B surface antigen  Result Value Ref Range   Hepatitis B Surface Ag NON-REACTIVE NON-REACTIVE  Hepatitis C antibody  Result Value Ref Range   Hepatitis C Ab NON-REACTIVE NON-REACTIVE  RPR  Result Value Ref Range   RPR Ser Ql NON-REACTIVE NON-REACTIVE  HIV Antibody (routine testing w rflx)  Result Value Ref Range   HIV FINAL INTERPRETATION     HIV 1&2 Ab, 4th Generation NON-REACTIVE NON-REACTIVE  T4, free  Result Value Ref Range   Free T4 0.94 0.60 - 1.60 ng/dL  TSH  Result Value Ref Range   TSH 0.58 0.35 - 5.50 uIU/mL  Lipid panel  Result Value Ref Range   Cholesterol 123 0 - 200 mg/dL   Triglycerides 891.9 0.0 - 149.0 mg/dL   HDL 71.89 (L) >60.99 mg/dL   VLDL 78.3 0.0 - 59.9 mg/dL   LDL Cholesterol 73 0 - 99 mg/dL   Total CHOL/HDL Ratio 4    NonHDL 95.07   Hemoglobin A1c  Result Value Ref Range   Hgb A1c MFr Bld 5.7 4.6 - 6.5 %  Comprehensive metabolic panel with GFR  Result Value Ref Range   Sodium 138 135 - 145 mEq/L   Potassium 3.8 3.5 - 5.1 mEq/L   Chloride 106 96 - 112 mEq/L   CO2 27 19 - 32 mEq/L   Glucose, Bld 85 70 - 99 mg/dL   BUN 7 6 - 23 mg/dL   Creatinine, Ser 9.05 0.40 - 1.20 mg/dL   Total Bilirubin 0.5 0.2 - 1.2 mg/dL   Alkaline Phosphatase 60 39 - 117 U/L   AST 14 0 - 37 U/L   ALT 13 0 - 35 U/L   Total Protein 7.0 6.0 - 8.3 g/dL   Albumin 4.0 3.5 - 5.2 g/dL   GFR 14.52 >39.99 mL/min   Calcium 9.1 8.4 - 10.5 mg/dL  CBC with Differential/Platelet  Result Value Ref Range   WBC 9.1 4.0 - 10.5 K/uL   RBC 5.22 (H) 3.87 - 5.11 Mil/uL   Hemoglobin 13.3 12.0 - 15.0 g/dL   HCT 60.7 63.9 - 53.9 %   MCV 75.0 (L) 78.0 - 100.0 fl   MCHC 33.9 30.0 - 36.0 g/dL   RDW 84.2 (H) 88.4 - 84.4 %   Platelets 337.0 150.0 - 400.0 K/uL    Neutrophils Relative % 64.6 43.0 - 77.0 %   Lymphocytes Relative 24.5 12.0 - 46.0 %   Monocytes Relative 8.4 3.0 - 12.0 %   Eosinophils Relative 2.4 0.0 - 5.0 %   Basophils Relative 0.1 0.0 - 3.0 %   Neutro Abs 5.9 1.4 - 7.7 K/uL   Lymphs Abs 2.2 0.7 - 4.0 K/uL   Monocytes Absolute 0.8 0.1 - 1.0 K/uL   Eosinophils Absolute 0.2 0.0 - 0.7 K/uL   Basophils Absolute 0.0 0.0 - 0.1 K/uL      Assessment & Plan:    Routine Health Maintenance and Physical Exam  Problem List Items Addressed This Visit     Asthma   Relevant Medications   albuterol  (VENTOLIN  HFA) 108 (90 Base) MCG/ACT inhaler   Severe obesity (BMI >= 40) (HCC)   Relevant Orders   CBC with Differential/Platelet (Completed)   Comprehensive metabolic panel with GFR (Completed)   Hemoglobin A1c (Completed)   Lipid panel (Completed)   TSH (Completed)   T4, free (Completed)   Other Visit Diagnoses       Encounter for  general adult medical examination with abnormal findings    -  Primary     Prediabetes       Relevant Orders   CBC with Differential/Platelet (Completed)   Comprehensive metabolic panel with GFR (Completed)   Hemoglobin A1c (Completed)     At risk for sexually transmitted disease due to unprotected sex       Relevant Orders   HIV Antibody (routine testing w rflx) (Completed)   GC/Chlamydia Probe Amp   RPR (Completed)   Hepatitis C antibody (Completed)   Hepatitis B surface antigen (Completed)     Low TSH level       Relevant Orders   TSH (Completed)   T4, free (Completed)      Preventive health care reviewed.  Counseling on healthy lifestyle including diet and exercise.  Recommend regular dental and eye exams.  Immunizations reviewed.  Discussed safety. She sees OB/GYN  Requests STD testing today. Asymptomatic.  Low TSH.  Advised her that I will order a thyroid  ultrasound if her TSH is still low.  No obvious nodules on exam.  Nontender thyroid . Prediabetes.  Counseling on healthy diet and exercise  for weight loss and blood sugar control. Follow-up pending results  Return for pending labs.     Boby Mackintosh, NP-C

## 2024-05-10 LAB — HIV ANTIBODY (ROUTINE TESTING W REFLEX): HIV 1&2 Ab, 4th Generation: NONREACTIVE

## 2024-05-10 LAB — HEPATITIS B SURFACE ANTIGEN: Hepatitis B Surface Ag: NONREACTIVE

## 2024-05-10 LAB — RPR: RPR Ser Ql: NONREACTIVE

## 2024-05-10 LAB — HEPATITIS C ANTIBODY: Hepatitis C Ab: NONREACTIVE

## 2024-05-14 ENCOUNTER — Ambulatory Visit: Payer: Self-pay | Admitting: Family Medicine

## 2024-05-14 LAB — GC/CHLAMYDIA PROBE AMP
Chlamydia trachomatis, NAA: NEGATIVE
Neisseria Gonorrhoeae by PCR: NEGATIVE

## 2024-06-17 ENCOUNTER — Ambulatory Visit (INDEPENDENT_AMBULATORY_CARE_PROVIDER_SITE_OTHER): Admitting: Family Medicine

## 2024-06-17 ENCOUNTER — Other Ambulatory Visit: Payer: Self-pay

## 2024-06-17 ENCOUNTER — Encounter: Payer: Self-pay | Admitting: Family Medicine

## 2024-06-17 ENCOUNTER — Other Ambulatory Visit (HOSPITAL_COMMUNITY)
Admission: RE | Admit: 2024-06-17 | Discharge: 2024-06-17 | Disposition: A | Discharge status: Home / Self Care | Source: Ambulatory Visit | Attending: Family Medicine | Admitting: Family Medicine

## 2024-06-17 VITALS — BP 85/69 | HR 112 | Wt 336.5 lb

## 2024-06-17 DIAGNOSIS — Z01419 Encounter for gynecological examination (general) (routine) without abnormal findings: Secondary | ICD-10-CM | POA: Diagnosis not present

## 2024-06-17 DIAGNOSIS — Z319 Encounter for procreative management, unspecified: Secondary | ICD-10-CM

## 2024-06-17 DIAGNOSIS — Z124 Encounter for screening for malignant neoplasm of cervix: Secondary | ICD-10-CM

## 2024-06-17 DIAGNOSIS — Z113 Encounter for screening for infections with a predominantly sexual mode of transmission: Secondary | ICD-10-CM

## 2024-06-17 DIAGNOSIS — Z3189 Encounter for other procreative management: Secondary | ICD-10-CM | POA: Diagnosis not present

## 2024-06-17 DIAGNOSIS — Z3169 Encounter for other general counseling and advice on procreation: Secondary | ICD-10-CM

## 2024-06-17 MED ORDER — PRENATAL VITAMINS 27-0.8 MG PO TABS: 1.0000 | ORAL_TABLET | Freq: Every day | ORAL | 1 refills | Status: AC

## 2024-06-17 NOTE — Assessment & Plan Note (Addendum)
 [id number] - Discussed 3 main reasons for infertility. 1. Lack of ovulation. 2. Tubal issue 3. Female factor. She appears to be ovulating. Has no real tubal factor that is discerned by hx. Would check semen analysis and HSG.

## 2024-06-17 NOTE — Patient Instructions (Signed)

## 2024-06-17 NOTE — Progress Notes (Signed)
 Subjective:     Debra [name] is a 23 y.o. female and is here for a comprehensive physical exam. The patient reports problems - seeking pregnancy x 1 year. Partner with 1 child, age 107. Has regular cycles. Having timed intercourse. No h/o GC/Chlam infection, no h/o PID. Cycles are not painful. Pre-diabetes on A1C and normal TSH 04/2024.   The following portions of the patient's history were reviewed and updated as appropriate: allergies, current medications, past family history, past medical history, past social history, past surgical history, and problem list.  Review of Systems Pertinent items noted in HPI and remainder of comprehensive ROS otherwise negative.   Objective:  Chaperone present for exam   BP (!) 85/69   Pulse (!) 112   Wt (!) 336 lb 8 oz (152.6 kg)   LMP 06/13/2024   BMI 56.00 kg/m  General appearance: alert, cooperative, appears stated age, and moderately obese Head: Normocephalic, without obvious abnormality, atraumatic Neck: no adenopathy, supple, symmetrical, trachea midline, and thyroid  not enlarged, symmetric, no tenderness/mass/nodules Lungs: clear to auscultation bilaterally Heart: regular rate and rhythm, S1, S2 normal, no murmur, click, rub or gallop Abdomen: soft, non-tender; bowel sounds normal; no masses,  no organomegaly Pelvic: cervix normal in appearance, external genitalia normal, no adnexal masses or tenderness, no cervical motion tenderness, uterus normal size, shape, and consistency, and vagina normal without discharge Extremities: extremities normal, atraumatic, no cyanosis or edema Pulses: 2+ and symmetric Skin: Skin color, texture, turgor normal. No rashes or lesions Lymph nodes: Cervical, supraclavicular, and axillary nodes normal. Neurologic: Grossly normal    Assessment:    Healthy female exam.      Plan:   Problem List Items Addressed This Visit       Unprioritized   Infertility management   [phone] - Discussed 3 main reasons for  infertility. 1. Lack of ovulation. 2. Tubal issue 3. Female factor. She appears to be ovulating. Has no real tubal factor that is discerned by hx. Would check semen analysis and HSG.         Relevant Orders   DG Hysterogram (HSG)   Other Visit Diagnoses       Encounter for gynecological examination without abnormal finding    -  Primary     Screening for malignant neoplasm of cervix       Relevant Orders   Cytology - PAP( Catharine)     Encounter for preconception consultation       begin PNV's.   Relevant Medications   Prenatal Vit-Fe Fumarate-FA (PRENATAL VITAMINS) 27-0.8 MG TABS     Screen for STD (sexually transmitted disease)       Relevant Orders   Hepatitis B surface antigen   Hepatitis C antibody   HIV Antibody (routine testing w rflx)   RPR      Return in 1 year (on 06/17/2025).    See After Visit Summary for Counseling Recommendations

## 2024-06-18 LAB — RPR: RPR Ser Ql: NONREACTIVE

## 2024-06-18 LAB — HIV ANTIBODY (ROUTINE TESTING W REFLEX): HIV Screen 4th Generation wRfx: NONREACTIVE

## 2024-06-18 LAB — HEPATITIS C ANTIBODY: Hep C Virus Ab: NONREACTIVE

## 2024-06-18 LAB — HEPATITIS B SURFACE ANTIGEN: Hepatitis B Surface Ag: NEGATIVE

## 2024-06-25 ENCOUNTER — Ambulatory Visit: Payer: Self-pay | Admitting: Family Medicine

## 2024-06-25 LAB — CYTOLOGY - PAP
Adequacy: ABSENT
Chlamydia: NEGATIVE
Comment: NEGATIVE
Comment: NEGATIVE
Comment: NEGATIVE
Comment: NORMAL
Diagnosis: UNDETERMINED — AB
High risk HPV: NEGATIVE
Neisseria Gonorrhea: NEGATIVE
Trichomonas: NEGATIVE

## 2024-07-16 ENCOUNTER — Other Ambulatory Visit: Payer: Self-pay | Admitting: Family Medicine

## 2024-07-16 DIAGNOSIS — J4520 Mild intermittent asthma, uncomplicated: Secondary | ICD-10-CM

## 2024-07-16 NOTE — Telephone Encounter (Unsigned)
 Copied from CRM #[id number]. Topic: Clinical - Medication Refill >> Jul 16, 2024 11:34 AM Precious C wrote: Medication: albuterol  (VENTOLIN  HFA) 108 (90 Base) MCG/ACT inhaler  Has the patient contacted their pharmacy? Yes (Agent: If no, request that the patient contact the pharmacy for the refill. If patient does not wish to contact the pharmacy document the reason why and proceed with request.) (Agent: If yes, when and what did the pharmacy advise?)  This is the patient's preferred pharmacy:   [id number] [place] - [address] [place] [place]  [address] Phone: [phone] Fax: [phone]  Is this the correct pharmacy for this prescription? Yes If no, delete pharmacy and type the correct one.   Has the prescription been filled recently? Yes  Is the patient out of the medication? Yes  Has the patient been seen for an appointment in the last year OR does the patient have an upcoming appointment? Yes  Can we respond through MyChart? Yes  Agent: Please be advised that Rx refills may take up to 3 business days. We ask that you follow-up with your pharmacy.

## 2024-07-17 ENCOUNTER — Ambulatory Visit
Admission: EM | Admit: 2024-07-17 | Discharge: 2024-07-17 | Disposition: A | Discharge status: Home / Self Care | Attending: Physician Assistant | Admitting: Physician Assistant

## 2024-07-17 ENCOUNTER — Ambulatory Visit: Payer: Self-pay

## 2024-07-17 DIAGNOSIS — J45901 Unspecified asthma with (acute) exacerbation: Secondary | ICD-10-CM

## 2024-07-17 MED ORDER — IPRATROPIUM-ALBUTEROL 0.5-2.5 (3) MG/3ML IN SOLN
3.0000 mL | Freq: Once | RESPIRATORY_TRACT | Status: AC
  Administered 2024-07-17: 3 mL via RESPIRATORY_TRACT

## 2024-07-17 MED ORDER — ALBUTEROL SULFATE HFA 108 (90 BASE) MCG/ACT IN AERS: 2.0000 | INHALATION_SPRAY | RESPIRATORY_TRACT | 1 refills | Status: AC | PRN

## 2024-07-17 MED ORDER — PREDNISONE 20 MG PO TABS
40.0000 mg | ORAL_TABLET | Freq: Every day | ORAL | 0 refills | Status: AC
Start: 2024-07-17 — End: 2024-07-22

## 2024-07-17 NOTE — Telephone Encounter (Signed)
 Patient seen in UC today -- FYI

## 2024-07-17 NOTE — Telephone Encounter (Signed)
 FYI Only or Action Required?: FYI only for provider.  Patient was last seen in primary care on 05/09/2024 by Lendia Boby CROME, NP-C.  Called Nurse Triage reporting Chest Pain and Cough.  Symptoms began 1-2 weeks ago.  Interventions attempted: Prescription medications: albuterol  inhaler.  Symptoms are: stable.  Triage Disposition: See HCP Within 4 Hours (Or PCP Triage)  Patient/caregiver understands and will follow disposition?: Yes                             Copied from CRM #[id number]. Topic: Clinical - Red Word Triage >> Jul 17, 2024 11:15 AM Debra [name] wrote: [facility] that prompted transfer to Nurse Triage: Chest is tightness Reason for Disposition  [1] MILD difficulty breathing (e.g., minimal/no SOB at rest, SOB with walking, pulse < 100) AND [2] still present when not coughing  Answer Assessment - Initial Assessment Questions 1. ONSET: When did the cough begin?      1-2 weeks ago 2. SEVERITY: How bad is the cough today?      Frequent coughing spells 3. SPUTUM: Describe the color of your sputum (e.g., none, dry cough; clear, white, yellow, green)     Denies, states cough is dry  5. DIFFICULTY BREATHING: Are you having difficulty breathing? If Yes, ask: How bad is it? (e.g., mild, moderate, severe)      States she is breathing normally, but experiences SOB after coughing spells, patient able to speak in clear and complete sentences while on phone with this RN 6. FEVER: Do you have a fever? If Yes, ask: What is your temperature, how was it measured, and when did it start?     Denies 7. CARDIAC HISTORY: Do you have any history of heart disease? (e.g., heart attack, congestive heart failure)      Denies 8. LUNG HISTORY: Do you have any history of lung disease?  (e.g., pulmonary embolus, asthma, emphysema)     Asthma, states she is out of refills of her albuterol  inhaler 10. OTHER SYMPTOMS: Do you have any other symptoms? (e.g., runny  nose, wheezing, chest pain)     Chest tightness for 3-4 days, wheezing, denies dizziness, denies NV, denies cold/flu symptoms    Patient states she has been without inhaler for 24 hours. States inhaler typically helps relieve symptoms. Advised evaluation within 4 hours, as symptoms began before she was out of inhaler. No availability in office or surrounding offices today. Advised UC. Patient verbalized understanding and agreed to go. This RN also called pharmacy and verified albuterol  inhaler is ready for pick-up.  Protocols used: Cough - Acute Non-Productive-A-AH

## 2024-07-17 NOTE — ED Triage Notes (Signed)
 I have Asthma, I am having a lot of harsh chest pains, sob, last week I was under the weather, that cold was improving except cough, I have been using my inhaler but the chest pains continue. My doctor's office refilled my inhaler but they said come get evaluated. PCP: [facility] ([facility]).

## 2024-07-18 ENCOUNTER — Emergency Department (HOSPITAL_COMMUNITY)
Admission: EM | Admit: 2024-07-18 | Discharge: 2024-07-18 | Disposition: A | Discharge status: Home / Self Care | Attending: Emergency Medicine | Admitting: Emergency Medicine

## 2024-07-18 ENCOUNTER — Other Ambulatory Visit: Payer: Self-pay

## 2024-07-18 ENCOUNTER — Emergency Department (HOSPITAL_COMMUNITY)

## 2024-07-18 ENCOUNTER — Encounter (HOSPITAL_COMMUNITY): Payer: Self-pay

## 2024-07-18 DIAGNOSIS — J069 Acute upper respiratory infection, unspecified: Secondary | ICD-10-CM | POA: Diagnosis not present

## 2024-07-18 DIAGNOSIS — J45901 Unspecified asthma with (acute) exacerbation: Secondary | ICD-10-CM | POA: Diagnosis not present

## 2024-07-18 DIAGNOSIS — R059 Cough, unspecified: Secondary | ICD-10-CM | POA: Diagnosis present

## 2024-07-18 DIAGNOSIS — E119 Type 2 diabetes mellitus without complications: Secondary | ICD-10-CM | POA: Diagnosis not present

## 2024-07-18 LAB — PREGNANCY, URINE: Preg Test, Ur: NEGATIVE

## 2024-07-18 MED ORDER — ALBUTEROL SULFATE HFA 108 (90 BASE) MCG/ACT IN AERS
1.0000 | INHALATION_SPRAY | RESPIRATORY_TRACT | Status: DC | PRN
  Administered 2024-07-18: 2 via RESPIRATORY_TRACT
  Filled 2024-07-18: qty 6.7

## 2024-07-18 MED ORDER — PREDNISONE 20 MG PO TABS
40.0000 mg | ORAL_TABLET | Freq: Once | ORAL | Status: AC
  Administered 2024-07-18: 40 mg via ORAL
  Filled 2024-07-18: qty 2

## 2024-07-18 MED ORDER — ALBUTEROL SULFATE (2.5 MG/3ML) 0.083% IN NEBU
5.0000 mg | INHALATION_SOLUTION | Freq: Once | RESPIRATORY_TRACT | Status: AC
  Administered 2024-07-18: 5 mg via RESPIRATORY_TRACT
  Filled 2024-07-18: qty 6

## 2024-07-18 MED ORDER — IPRATROPIUM BROMIDE 0.02 % IN SOLN
0.5000 mg | Freq: Once | RESPIRATORY_TRACT | Status: AC
  Administered 2024-07-18: 0.5 mg via RESPIRATORY_TRACT
  Filled 2024-07-18: qty 2.5

## 2024-07-18 NOTE — ED Provider Notes (Signed)
 [name]    CSN: [id number] Arrival date & time: 07/17/24  1154      History   Chief Complaint Chief Complaint  Patient presents with   Chest Pain   Shortness of Breath    HPI Debra [name] is a 23 y.o. female.   Patient here today for evaluation of chest pain that started after she was having cold symptoms last week.  She does have known asthma and states she has been using her inhaler but she continues to have some chest pain.  She reports that she did get her inhaler refilled but they recommended further evaluation in person.  She does not report any lightheadedness.  The history is provided by the patient.  Chest Pain Associated symptoms: shortness of breath   Associated symptoms: no abdominal pain, no fever, no nausea and no vomiting   Shortness of Breath Associated symptoms: chest pain   Associated symptoms: no abdominal pain, no fever and no vomiting     Past Medical History:  Diagnosis Date   Allergy    Asthma    Diabetes mellitus without complication [facility])     Patient Active Problem List   Diagnosis Date Noted   Infertility management 06/17/2024   Unprotected sex 09/11/2023   Anemia 09/11/2023   Asthma 09/28/2017   Severe obesity (BMI >= 40) (HCC) 05/29/2015    Past Surgical History:  Procedure Laterality Date   ADENOIDECTOMY     at age 54 years    OB History     Gravida  0   Para  0   Term  0   Preterm  0   AB  0   Living  0      SAB  0   IAB  0   Ectopic  0   Multiple  0   Live Births  0            Home Medications    Prior to Admission medications   Medication Sig Start Date End Date Taking? Authorizing Provider  albuterol  (VENTOLIN  HFA) 108 (90 Base) MCG/ACT inhaler Inhale 2 puffs into the lungs every 4 (four) hours as needed for wheezing or shortness of breath. 07/17/24  Yes Henson, Vickie L, NP-C  predniSONE  (DELTASONE ) 20 MG tablet Take 2 tablets (40 mg total) by mouth daily with breakfast for 5  days. 07/17/24 07/22/24 Yes Billy Asberry FALCON, PA-C  Prenatal Vit-Fe Fumarate-FA (PRENATAL VITAMINS) 27-0.8 MG TABS Take 1 tablet by mouth daily. 06/17/24   Fredirick Glenys RAMAN, MD    Family History Family History  Problem Relation Age of Onset   Hypertension Mother    Hypertension Father    Asthma Brother    Mental illness Maternal Grandmother    Drug abuse Maternal Grandmother    Depression Maternal Grandmother    Cancer Maternal Grandfather    Hypertension Maternal Grandfather    Alcohol abuse Maternal Grandfather    Arthritis Maternal Grandfather    Drug abuse Maternal Grandfather    Diabetes Paternal Grandmother    Alcohol abuse Paternal Grandmother    Arthritis Paternal Grandmother    Drug abuse Paternal Grandmother    Hearing loss Paternal Grandmother    Hypertension Paternal Grandmother     Social History Social History   Tobacco Use   Smoking status: Never   Smokeless tobacco: Never  Vaping Use   Vaping status: Never Used  Substance Use Topics   Alcohol use: Not Currently    Comment: Occassionally.  Drug use: Not Currently    Types: Marijuana    Comment: daily or weekly     Allergies   Patient has no known allergies.   Review of Systems Review of Systems  Constitutional:  Negative for chills and fever.  Eyes:  Negative for discharge and redness.  Respiratory:  Positive for shortness of breath.   Cardiovascular:  Positive for chest pain.  Gastrointestinal:  Negative for abdominal pain, nausea and vomiting.     Physical Exam Triage Vital Signs ED Triage Vitals  Encounter Vitals Group     BP 07/17/24 1207 115/82     Girls Systolic BP Percentile --      Girls Diastolic BP Percentile --      Boys Systolic BP Percentile --      Boys Diastolic BP Percentile --      Pulse Rate 07/17/24 1207 99     Resp 07/17/24 1207 (!) 22     Temp 07/17/24 1207 98.2 F (36.8 C)     Temp Source 07/17/24 1207 Oral     SpO2 07/17/24 1207 95 %     Weight 07/17/24 1204 (!)  336 lb 6.8 oz (152.6 kg)     Height 07/17/24 1204 5' 5 (1.651 m)     Head Circumference --      Peak Flow --      Pain Score 07/17/24 1159 7     Pain Loc --      Pain Education --      Exclude from Growth Chart --    No data found.  Updated Vital Signs BP 115/82 (BP Location: Left Arm)   Pulse 99   Temp 98.2 F (36.8 C) (Oral)   Resp (!) 22   Ht 5' 5 (1.651 m)   Wt (!) 336 lb 6.8 oz (152.6 kg)   LMP 06/13/2024   SpO2 95%   BMI 55.98 kg/m   Visual Acuity Right Eye Distance:   Left Eye Distance:   Bilateral Distance:    Right Eye Near:   Left Eye Near:    Bilateral Near:     Physical Exam Vitals and nursing note reviewed.  Constitutional:      General: She is not in acute distress.    Appearance: Normal appearance. She is not ill-appearing.  HENT:     Head: Normocephalic and atraumatic.  Eyes:     Conjunctiva/sclera: Conjunctivae normal.  Cardiovascular:     Rate and Rhythm: Normal rate and regular rhythm.  Pulmonary:     Effort: Pulmonary effort is normal. No respiratory distress.     Breath sounds: Wheezing (mild scattered prior to neb treatment) present. No rhonchi or rales.  Neurological:     Mental Status: She is alert.  Psychiatric:        Mood and Affect: Mood normal.        Behavior: Behavior normal.        Thought Content: Thought content normal.      UC Treatments / Results  Labs (all labs ordered are listed, but only abnormal results are displayed) Labs Reviewed - No data to display  EKG   Radiology DG Chest 2 View Result Date: 07/18/2024 CLINICAL DATA:  Chest pain EXAM: CHEST - 2 VIEW COMPARISON:  08/29/2014 FINDINGS: Cardiomediastinal silhouette and pulmonary vasculature are within normal limits. Lungs are clear. IMPRESSION: No acute cardiopulmonary process. Electronically Signed   By: Aliene Lloyd M.D.   On: 07/18/2024 09:32    Procedures Procedures (including critical  care time)  Medications Ordered in UC Medications   ipratropium-albuterol  (DUONEB) 0.5-2.5 (3) MG/3ML nebulizer solution 3 mL (3 mLs Nebulization Given 07/17/24 1247)    Initial Impression / Assessment and Plan / UC Course  I have reviewed the triage vital signs and the nursing notes.  Pertinent labs & imaging results that were available during my care of the patient were reviewed by me and considered in my medical decision making (see chart for details).     Will trial steroid burst for suspected asthma exacerbation.  DuoNeb administered in office with some relief.  Recommended further evaluation in the emergency department if symptoms do not improve or if they worsen in any way.  Patient expressed understanding.   Final Clinical Impressions(s) / UC Diagnoses   Final diagnoses:  Asthma with acute exacerbation, unspecified asthma severity, unspecified whether persistent   Discharge Instructions   None    ED Prescriptions     Medication Sig Dispense Auth. Provider   predniSONE  (DELTASONE ) 20 MG tablet Take 2 tablets (40 mg total) by mouth daily with breakfast for 5 days. 10 tablet Billy Asberry FALCON, PA-C      PDMP not reviewed this encounter.   Billy Asberry FALCON, PA-C 07/18/24 [phone]

## 2024-07-18 NOTE — ED Notes (Addendum)
 EKG performed and given to Dr. Patsey.

## 2024-07-18 NOTE — ED Triage Notes (Signed)
 Pt c/o chest pain that started  about 4 days ago. Pt went to UC and received a breathing treatment and got some relief but states now the pain is moving down into her stomach/epigastic area. Hurts to take a deep breath and cough. Denies any n/v.

## 2024-07-18 NOTE — ED Provider Notes (Signed)
 [place] EMERGENCY DEPARTMENT AT [facility] Provider Note   CSN: [id number] Arrival date & time: 07/18/24  0830     Patient presents with: Chest Pain   Debra [name] is a 23 y.o. female.    Chest Pain Patient presents with chest pain and cough.  Has had symptoms for around a week now.  Started with URI symptoms cough with some production.  The production cleared up and then just dry cough.  History of asthma.  Continued symptoms.  And ran out of inhaler.  Went to urgent care yesterday.  Had reportedly breathing treatments feeling better.  However today feeling worse.  Had been given prescription for inhaler and prednisone  but has not had either filled yet.  Does have chest tightness.  States it feels like her asthma.  Patient states her menses is 2 days late.    Past Medical History:  Diagnosis Date   Allergy    Asthma    Diabetes mellitus without complication (HCC)     Prior to Admission medications   Medication Sig Start Date End Date Taking? Authorizing Provider  albuterol  (VENTOLIN  HFA) 108 (90 Base) MCG/ACT inhaler Inhale 2 puffs into the lungs every 4 (four) hours as needed for wheezing or shortness of breath. 07/17/24   Henson, Vickie L, NP-C  predniSONE  (DELTASONE ) 20 MG tablet Take 2 tablets (40 mg total) by mouth daily with breakfast for 5 days. 07/17/24 07/22/24  Billy Asberry FALCON, PA-C  Prenatal Vit-Fe Fumarate-FA (PRENATAL VITAMINS) 27-0.8 MG TABS Take 1 tablet by mouth daily. 06/17/24   Fredirick Glenys RAMAN, MD    Allergies: Patient has no known allergies.    Review of Systems  Cardiovascular:  Positive for chest pain.    Updated Vital Signs BP (!) 150/113 (BP Location: Left Arm)   Pulse 94   Temp 98.6 F (37 C) (Oral)   Resp 20   LMP 06/13/2024 (Approximate)   SpO2 97%   Physical Exam Vitals and nursing note reviewed.  Cardiovascular:     Rate and Rhythm: Regular rhythm.  Pulmonary:     Breath sounds: Wheezing present.     Comments: Mild  diffuse wheezes without severe respiratory distress. Musculoskeletal:     Right lower leg: No edema.     Left lower leg: No edema.  Skin:    Capillary Refill: Capillary refill takes less than 2 seconds.  Neurological:     Mental Status: She is alert.     (all labs ordered are listed, but only abnormal results are displayed) Labs Reviewed  PREGNANCY, URINE    EKG: EKG Interpretation Date/Time:  Thursday July 18 2024 08:47:17 EDT Ventricular Rate:  98 PR Interval:  144 QRS Duration:  79 QT Interval:  361 QTC Calculation: 461 R Axis:   71  Text Interpretation: Sinus rhythm Borderline T wave abnormalities No old tracing to compare Confirmed by Patsey Lot [phone]) on 07/18/2024 9:12:19 AM  Radiology: [facility] Chest 2 View Result Date: 07/18/2024 CLINICAL DATA:  Chest pain EXAM: CHEST - 2 VIEW COMPARISON:  08/29/2014 FINDINGS: Cardiomediastinal silhouette and pulmonary vasculature are within normal limits. Lungs are clear. IMPRESSION: No acute cardiopulmonary process. Electronically Signed   By: Aliene Lloyd M.D.   On: 07/18/2024 09:32     Procedures   Medications Ordered in the ED  albuterol  (VENTOLIN  HFA) 108 (90 Base) MCG/ACT inhaler 1-2 puff (2 puffs Inhalation Given 07/18/24 1025)  albuterol  (PROVENTIL ) (2.5 MG/3ML) 0.083% nebulizer solution 5 mg (5 mg Nebulization Given 07/18/24  9068)  ipratropium (ATROVENT ) nebulizer solution 0.5 mg (0.5 mg Nebulization Given 07/18/24 0932)  predniSONE  (DELTASONE ) tablet 40 mg (40 mg Oral Given 07/18/24 1024)                                    Medical Decision Making Amount and/or Complexity of Data Reviewed Labs: ordered. Radiology: ordered.  Risk Prescription drug management.   Patient with shortness of breath cough and wheezing.  History of asthma.  Likely started as URI.  Differential diagnosis does include pneumonia and asthma exacerbation.  Out of her albuterol  at home.  Will give breathing treatment here get x-ray and  EKG.  X-ray shows no pneumonia.  EKG reassuring.  Negative pregnancy test.  Feeling better after treatment.  Will give inhaler here.  Given first dose of steroids.  Has inhaler and steroids prescription already at the pharmacy.  Will discharge home.     Final diagnoses:  Upper respiratory tract infection, unspecified type  Exacerbation of asthma, unspecified asthma severity, unspecified whether persistent    ED Discharge Orders     None          Patsey Lot, MD 07/18/24 1032

## 2024-07-18 NOTE — ED Notes (Signed)
 Patient transported to X-ray

## 2024-07-18 NOTE — Discharge Instructions (Addendum)
 Fill the prescriptions you were given yesterday.

## 2024-10-27 ENCOUNTER — Ambulatory Visit: Admission: EM | Admit: 2024-10-27 | Discharge: 2024-10-27 | Disposition: A | Discharge status: Home / Self Care

## 2024-10-27 ENCOUNTER — Encounter: Payer: Self-pay | Admitting: Emergency Medicine

## 2024-10-27 DIAGNOSIS — K047 Periapical abscess without sinus: Secondary | ICD-10-CM | POA: Diagnosis not present

## 2024-10-27 DIAGNOSIS — K0889 Other specified disorders of teeth and supporting structures: Secondary | ICD-10-CM

## 2024-10-27 MED ORDER — AMOXICILLIN-POT CLAVULANATE 875-125 MG PO TABS: 1.0000 | ORAL_TABLET | Freq: Two times a day (BID) | ORAL | 0 refills | Status: AC

## 2024-10-27 MED ORDER — METHYLPREDNISOLONE 4 MG PO TBPK: ORAL_TABLET | ORAL | 0 refills | Status: AC

## 2024-10-27 NOTE — Discharge Instructions (Addendum)
 Please follow-up with dentist.  Do not take NSAIDs with steroids, may only use Tylenol.  Take antibiotics in their entirety.

## 2024-10-27 NOTE — ED Triage Notes (Signed)
 Pt presents c/o dental pain x 21 days. Pt states,  My mouth been hurting for like the last 3 weeks. I have tried Tylenol, Ibuprofen, BC and a a plethora of other things.

## 2024-10-27 NOTE — ED Provider Notes (Signed)
 [name]    CSN: [id number] Arrival date & time: 10/27/24  1132      History   Chief Complaint Chief Complaint  Patient presents with   Dental Pain    HPI Debra [name] is a 23 y.o. female.   Patient presents today due to dental pain for the past month.  Patient states she has been taking BC powder for pain with relief but lately BC powder has not been helping with pain.  Patient does not have a dentist and is unsure if she has dental insurance.  States that she has pain in a tooth on the right lower portion of her mouth and an abscess of the tooth on the left upper portion of her mouth.  The history is provided by the patient.  Dental Pain   Past Medical History:  Diagnosis Date   Allergy    Asthma    Diabetes mellitus without complication [facility])     Patient Active Problem List   Diagnosis Date Noted   Infertility management 06/17/2024   Unprotected sex 09/11/2023   Anemia 09/11/2023   Asthma 09/28/2017   Severe obesity (BMI >= 40) (HCC) 05/29/2015    Past Surgical History:  Procedure Laterality Date   ADENOIDECTOMY     at age 15 years    OB History     Gravida  0   Para  0   Term  0   Preterm  0   AB  0   Living  0      SAB  0   IAB  0   Ectopic  0   Multiple  0   Live Births  0            Home Medications    Prior to Admission medications  Medication Sig Start Date End Date Taking? Authorizing Provider  amoxicillin -clavulanate (AUGMENTIN ) 875-125 MG tablet Take 1 tablet by mouth every 12 (twelve) hours for 10 days. 10/27/24 11/06/24 Yes Andra Corean BROCKS, PA-C  methylPREDNISolone  (MEDROL  DOSEPAK) 4 MG TBPK tablet Take as directed on back of package 10/27/24  Yes Andra Corean BROCKS, PA-C  albuterol  (VENTOLIN  HFA) 108 (90 Base) MCG/ACT inhaler Inhale 2 puffs into the lungs every 4 (four) hours as needed for wheezing or shortness of breath. 07/17/24   Henson, Vickie L, NP-C  Prenatal Vit-Fe Fumarate-FA  (PRENATAL VITAMINS) 27-0.8 MG TABS Take 1 tablet by mouth daily. 06/17/24   Fredirick Glenys RAMAN, MD    Family History Family History  Problem Relation Age of Onset   Hypertension Mother    Hypertension Father    Asthma Brother    Mental illness Maternal Grandmother    Drug abuse Maternal Grandmother    Depression Maternal Grandmother    Cancer Maternal Grandfather    Hypertension Maternal Grandfather    Alcohol abuse Maternal Grandfather    Arthritis Maternal Grandfather    Drug abuse Maternal Grandfather    Diabetes Paternal Grandmother    Alcohol abuse Paternal Grandmother    Arthritis Paternal Grandmother    Drug abuse Paternal Grandmother    Hearing loss Paternal Grandmother    Hypertension Paternal Grandmother     Social History Social History[1]   Allergies   Patient has no known allergies.   Review of Systems Review of Systems   Physical Exam Triage Vital Signs ED Triage Vitals  Encounter Vitals Group     BP 10/27/24 1314 (!) 155/100     Girls Systolic BP Percentile --  Girls Diastolic BP Percentile --      Boys Systolic BP Percentile --      Boys Diastolic BP Percentile --      Pulse Rate 10/27/24 1314 74     Resp 10/27/24 1314 18     Temp 10/27/24 1314 98.3 F (36.8 C)     Temp Source 10/27/24 1314 Oral     SpO2 10/27/24 1314 98 %     Weight 10/27/24 1313 (!) 336 lb 6.8 oz (152.6 kg)     Height --      Head Circumference --      Peak Flow --      Pain Score 10/27/24 1310 10     Pain Loc --      Pain Education --      Exclude from Growth Chart --    No data found.  Updated Vital Signs BP (!) 155/100 (BP Location: Left Arm)   Pulse 74   Temp 98.3 F (36.8 C) (Oral)   Resp 18   Wt (!) 336 lb 6.8 oz (152.6 kg)   LMP 10/04/2024 (Exact Date)   SpO2 98%   BMI 55.98 kg/m   Visual Acuity Right Eye Distance:   Left Eye Distance:   Bilateral Distance:    Right Eye Near:   Left Eye Near:    Bilateral Near:     Physical Exam Vitals and  nursing note reviewed.  Constitutional:      General: She is not in acute distress.    Appearance: Normal appearance. She is not ill-appearing, toxic-appearing or diaphoretic.  HENT:     Mouth/Throat:      Comments: Tenderness to palpation of gums around tooth identified in diagram Eyes:     General: No scleral icterus. Cardiovascular:     Rate and Rhythm: Normal rate and regular rhythm.     Heart sounds: Normal heart sounds.  Pulmonary:     Effort: Pulmonary effort is normal. No respiratory distress.     Breath sounds: Normal breath sounds. No wheezing or rhonchi.  Skin:    General: Skin is warm.  Neurological:     Mental Status: She is alert and oriented to person, place, and time.  Psychiatric:        Mood and Affect: Mood normal.        Behavior: Behavior normal.      UC Treatments / Results  Labs (all labs ordered are listed, but only abnormal results are displayed) Labs Reviewed - No data to display  EKG   Radiology No results found.  Procedures Procedures (including critical care time)  Medications Ordered in UC Medications - No data to display  Initial Impression / Assessment and Plan / UC Course  I have reviewed the triage vital signs and the nursing notes.  Pertinent labs & imaging results that were available during my care of the patient were reviewed by me and considered in my medical decision making (see chart for details).     Final Clinical Impressions(s) / UC Diagnoses   Final diagnoses:  Pain, dental  Dental abscess     Discharge Instructions      Please follow-up with dentist.  Do not take NSAIDs with steroids, may only use Tylenol.  Take antibiotics in their entirety.     ED Prescriptions     Medication Sig Dispense Auth. Provider   amoxicillin -clavulanate (AUGMENTIN ) 875-125 MG tablet Take 1 tablet by mouth every 12 (twelve) hours for 10 days. 20 tablet Andra,  Corean BROCKS, PA-C   methylPREDNISolone  (MEDROL  DOSEPAK) 4 MG  TBPK tablet Take as directed on back of package 21 tablet Andra Corean BROCKS, PA-C      PDMP not reviewed this encounter.    [1]  Social History Tobacco Use   Smoking status: Never    Passive exposure: Never   Smokeless tobacco: Never  Vaping Use   Vaping status: Never Used  Substance Use Topics   Alcohol use: Not Currently    Comment: Occassionally.   Drug use: Not Currently    Types: Marijuana    Comment: daily or weekly     Andra Corean BROCKS, PA-C 10/27/24 1334
